# Patient Record
Sex: Female | Born: 1935 | Race: White | Hispanic: No | Smoking: Never smoker
Health system: Southern US, Community
[De-identification: ages and names within clinical notes are randomized; demographics above are authoritative.]

## PROBLEM LIST (undated history)

## (undated) DIAGNOSIS — H409 Unspecified glaucoma: Secondary | ICD-10-CM

## (undated) DIAGNOSIS — E079 Disorder of thyroid, unspecified: Secondary | ICD-10-CM

## (undated) DIAGNOSIS — I10 Essential (primary) hypertension: Secondary | ICD-10-CM

## (undated) DIAGNOSIS — F0390 Unspecified dementia without behavioral disturbance: Secondary | ICD-10-CM

## (undated) HISTORY — PX: ABDOMINAL HYSTERECTOMY: SHX81

## (undated) HISTORY — PX: BACK SURGERY: SHX140

## (undated) HISTORY — PX: OTHER SURGICAL HISTORY: SHX169

## (undated) HISTORY — PX: EYE SURGERY: SHX253

## (undated) HISTORY — DX: Unspecified glaucoma: H40.9

## (undated) HISTORY — DX: Unspecified dementia, unspecified severity, without behavioral disturbance, psychotic disturbance, mood disturbance, and anxiety: F03.90

---

## 2001-11-09 ENCOUNTER — Ambulatory Visit (HOSPITAL_COMMUNITY): Admission: RE | Admit: 2001-11-09 | Discharge: 2001-11-09 | Payer: Self-pay | Admitting: Internal Medicine

## 2001-11-09 ENCOUNTER — Encounter: Payer: Self-pay | Admitting: Internal Medicine

## 2001-12-23 ENCOUNTER — Encounter: Payer: Self-pay | Admitting: *Deleted

## 2001-12-23 ENCOUNTER — Emergency Department (HOSPITAL_COMMUNITY): Admission: EM | Admit: 2001-12-23 | Discharge: 2001-12-23 | Payer: Self-pay | Admitting: *Deleted

## 2002-09-28 HISTORY — PX: THYROIDECTOMY: SHX17

## 2002-11-10 ENCOUNTER — Ambulatory Visit (HOSPITAL_COMMUNITY): Admission: RE | Admit: 2002-11-10 | Discharge: 2002-11-10 | Payer: Self-pay | Admitting: Internal Medicine

## 2002-11-10 ENCOUNTER — Encounter: Payer: Self-pay | Admitting: Internal Medicine

## 2003-01-13 ENCOUNTER — Emergency Department (HOSPITAL_COMMUNITY): Admission: EM | Admit: 2003-01-13 | Discharge: 2003-01-13 | Payer: Self-pay | Admitting: Emergency Medicine

## 2003-01-29 ENCOUNTER — Encounter: Payer: Self-pay | Admitting: Otolaryngology

## 2003-01-29 ENCOUNTER — Ambulatory Visit (HOSPITAL_COMMUNITY): Admission: RE | Admit: 2003-01-29 | Discharge: 2003-01-29 | Payer: Self-pay | Admitting: Otolaryngology

## 2003-01-30 ENCOUNTER — Ambulatory Visit (HOSPITAL_COMMUNITY): Admission: RE | Admit: 2003-01-30 | Discharge: 2003-01-30 | Payer: Self-pay | Admitting: Internal Medicine

## 2003-02-22 ENCOUNTER — Encounter: Payer: Self-pay | Admitting: Otolaryngology

## 2003-02-22 ENCOUNTER — Encounter: Admission: RE | Admit: 2003-02-22 | Discharge: 2003-02-22 | Payer: Self-pay | Admitting: Otolaryngology

## 2003-04-17 ENCOUNTER — Encounter: Admission: RE | Admit: 2003-04-17 | Discharge: 2003-04-17 | Payer: Self-pay | Admitting: Otolaryngology

## 2003-04-17 ENCOUNTER — Encounter: Payer: Self-pay | Admitting: Otolaryngology

## 2003-05-30 ENCOUNTER — Encounter: Payer: Self-pay | Admitting: Otolaryngology

## 2003-05-30 ENCOUNTER — Encounter: Admission: RE | Admit: 2003-05-30 | Discharge: 2003-05-30 | Payer: Self-pay | Admitting: Otolaryngology

## 2003-06-25 ENCOUNTER — Encounter: Payer: Self-pay | Admitting: Otolaryngology

## 2003-06-28 ENCOUNTER — Observation Stay (HOSPITAL_COMMUNITY): Admission: RE | Admit: 2003-06-28 | Discharge: 2003-06-29 | Payer: Self-pay | Admitting: Otolaryngology

## 2003-06-28 ENCOUNTER — Encounter (INDEPENDENT_AMBULATORY_CARE_PROVIDER_SITE_OTHER): Payer: Self-pay | Admitting: *Deleted

## 2003-08-08 ENCOUNTER — Ambulatory Visit (HOSPITAL_COMMUNITY): Admission: RE | Admit: 2003-08-08 | Discharge: 2003-08-08 | Payer: Self-pay | Admitting: Internal Medicine

## 2003-08-14 ENCOUNTER — Ambulatory Visit (HOSPITAL_COMMUNITY): Admission: RE | Admit: 2003-08-14 | Discharge: 2003-08-14 | Payer: Self-pay | Admitting: Internal Medicine

## 2003-08-24 ENCOUNTER — Ambulatory Visit (HOSPITAL_COMMUNITY): Admission: RE | Admit: 2003-08-24 | Discharge: 2003-08-24 | Payer: Self-pay | Admitting: Otolaryngology

## 2003-11-13 ENCOUNTER — Ambulatory Visit (HOSPITAL_COMMUNITY): Admission: RE | Admit: 2003-11-13 | Discharge: 2003-11-13 | Payer: Self-pay | Admitting: Internal Medicine

## 2004-01-28 ENCOUNTER — Ambulatory Visit (HOSPITAL_COMMUNITY): Admission: RE | Admit: 2004-01-28 | Discharge: 2004-01-28 | Payer: Self-pay | Admitting: Internal Medicine

## 2004-02-20 ENCOUNTER — Ambulatory Visit (HOSPITAL_COMMUNITY): Admission: RE | Admit: 2004-02-20 | Discharge: 2004-02-20 | Payer: Self-pay | Admitting: Specialist

## 2004-11-14 ENCOUNTER — Ambulatory Visit (HOSPITAL_COMMUNITY): Admission: RE | Admit: 2004-11-14 | Discharge: 2004-11-14 | Payer: Self-pay | Admitting: Family Medicine

## 2005-01-21 ENCOUNTER — Ambulatory Visit: Payer: Self-pay | Admitting: Internal Medicine

## 2005-01-28 ENCOUNTER — Ambulatory Visit (HOSPITAL_COMMUNITY): Admission: RE | Admit: 2005-01-28 | Discharge: 2005-01-28 | Payer: Self-pay | Admitting: Family Medicine

## 2005-01-29 ENCOUNTER — Ambulatory Visit: Payer: Self-pay | Admitting: Cardiology

## 2005-01-29 ENCOUNTER — Inpatient Hospital Stay (HOSPITAL_COMMUNITY): Admission: AD | Admit: 2005-01-29 | Discharge: 2005-01-30 | Payer: Self-pay | Admitting: Internal Medicine

## 2005-03-17 ENCOUNTER — Ambulatory Visit (HOSPITAL_COMMUNITY): Admission: RE | Admit: 2005-03-17 | Discharge: 2005-03-17 | Payer: Self-pay | Admitting: Family Medicine

## 2005-03-23 ENCOUNTER — Ambulatory Visit: Payer: Self-pay | Admitting: Internal Medicine

## 2005-03-24 ENCOUNTER — Ambulatory Visit (HOSPITAL_COMMUNITY): Admission: RE | Admit: 2005-03-24 | Discharge: 2005-03-24 | Payer: Self-pay | Admitting: Family Medicine

## 2005-06-24 ENCOUNTER — Ambulatory Visit: Payer: Self-pay | Admitting: Internal Medicine

## 2005-07-03 ENCOUNTER — Ambulatory Visit (HOSPITAL_COMMUNITY): Admission: RE | Admit: 2005-07-03 | Discharge: 2005-07-03 | Payer: Self-pay | Admitting: Family Medicine

## 2005-11-10 ENCOUNTER — Ambulatory Visit (HOSPITAL_COMMUNITY): Admission: RE | Admit: 2005-11-10 | Discharge: 2005-11-10 | Payer: Self-pay | Admitting: Family Medicine

## 2005-11-26 ENCOUNTER — Ambulatory Visit (HOSPITAL_COMMUNITY): Admission: RE | Admit: 2005-11-26 | Discharge: 2005-11-26 | Payer: Self-pay | Admitting: Family Medicine

## 2005-11-26 ENCOUNTER — Ambulatory Visit: Payer: Self-pay | Admitting: Internal Medicine

## 2006-06-09 ENCOUNTER — Ambulatory Visit: Payer: Self-pay | Admitting: Internal Medicine

## 2006-09-04 ENCOUNTER — Emergency Department (HOSPITAL_COMMUNITY): Admission: EM | Admit: 2006-09-04 | Discharge: 2006-09-04 | Payer: Self-pay | Admitting: Emergency Medicine

## 2006-09-06 ENCOUNTER — Ambulatory Visit: Payer: Self-pay | Admitting: Orthopedic Surgery

## 2006-09-27 ENCOUNTER — Ambulatory Visit: Payer: Self-pay | Admitting: Orthopedic Surgery

## 2006-10-18 ENCOUNTER — Ambulatory Visit (HOSPITAL_COMMUNITY): Admission: RE | Admit: 2006-10-18 | Discharge: 2006-10-18 | Payer: Self-pay | Admitting: Family Medicine

## 2006-12-01 ENCOUNTER — Ambulatory Visit: Payer: Self-pay | Admitting: Internal Medicine

## 2006-12-07 ENCOUNTER — Ambulatory Visit (HOSPITAL_COMMUNITY): Admission: RE | Admit: 2006-12-07 | Discharge: 2006-12-07 | Payer: Self-pay | Admitting: Family Medicine

## 2007-02-10 ENCOUNTER — Ambulatory Visit: Payer: Self-pay | Admitting: Orthopedic Surgery

## 2007-02-14 ENCOUNTER — Ambulatory Visit (HOSPITAL_COMMUNITY): Admission: RE | Admit: 2007-02-14 | Discharge: 2007-02-14 | Payer: Self-pay | Admitting: Orthopedic Surgery

## 2007-02-22 ENCOUNTER — Ambulatory Visit: Payer: Self-pay | Admitting: Orthopedic Surgery

## 2007-03-24 ENCOUNTER — Ambulatory Visit: Payer: Self-pay | Admitting: Orthopedic Surgery

## 2007-04-21 ENCOUNTER — Ambulatory Visit: Payer: Self-pay | Admitting: Orthopedic Surgery

## 2007-06-06 IMAGING — CR DG CHEST 2V
2 series · 2 of 2 positions shown · non-contrast
Comparison: none

HISTORY: Chest pain, dyspnea

CHEST 2 VIEWS:
Comparison 01/29/2005
Normal heart size, mediastinal contours, and pulmonary vascularity.
Changes of COPD and chronic bronchitis.
Mild chronic interstitial prominence stable.
No acute infiltrate or effusion.
Mild bony demineralization.

[view not recorded (1 of 2)]
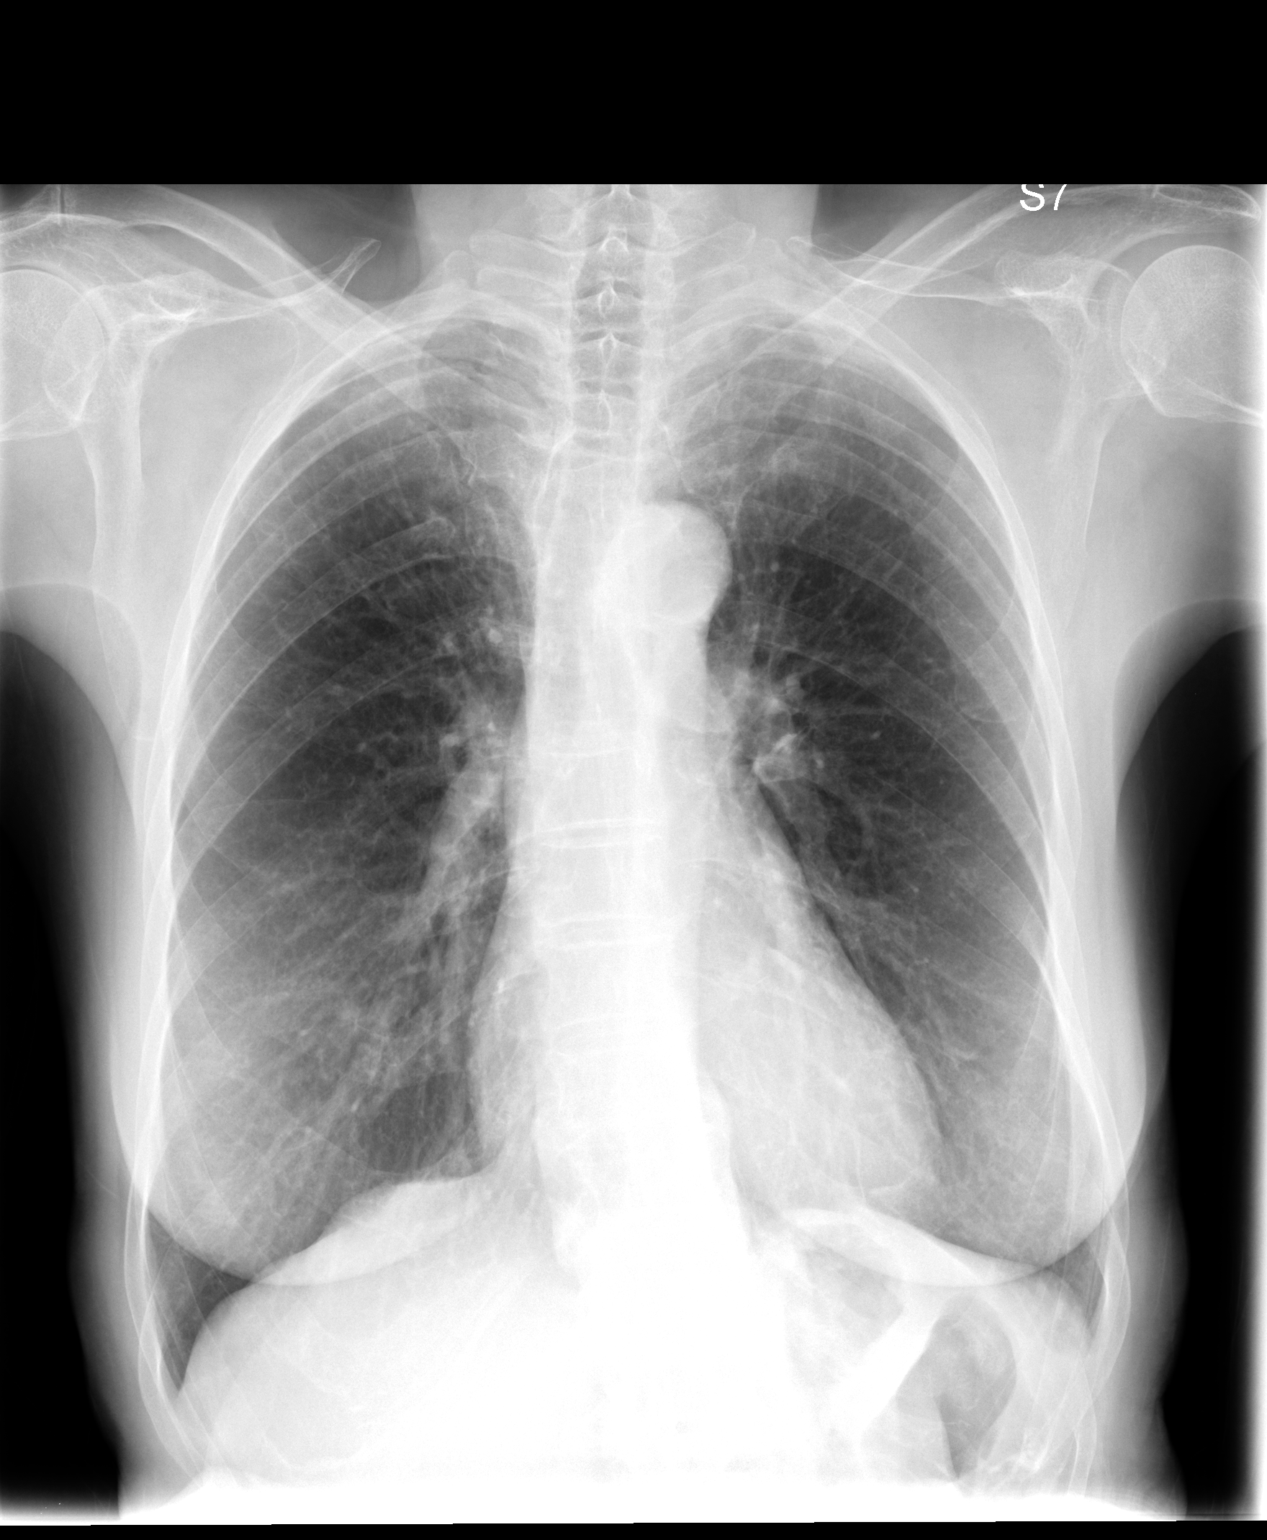

[view not recorded (2 of 2)]
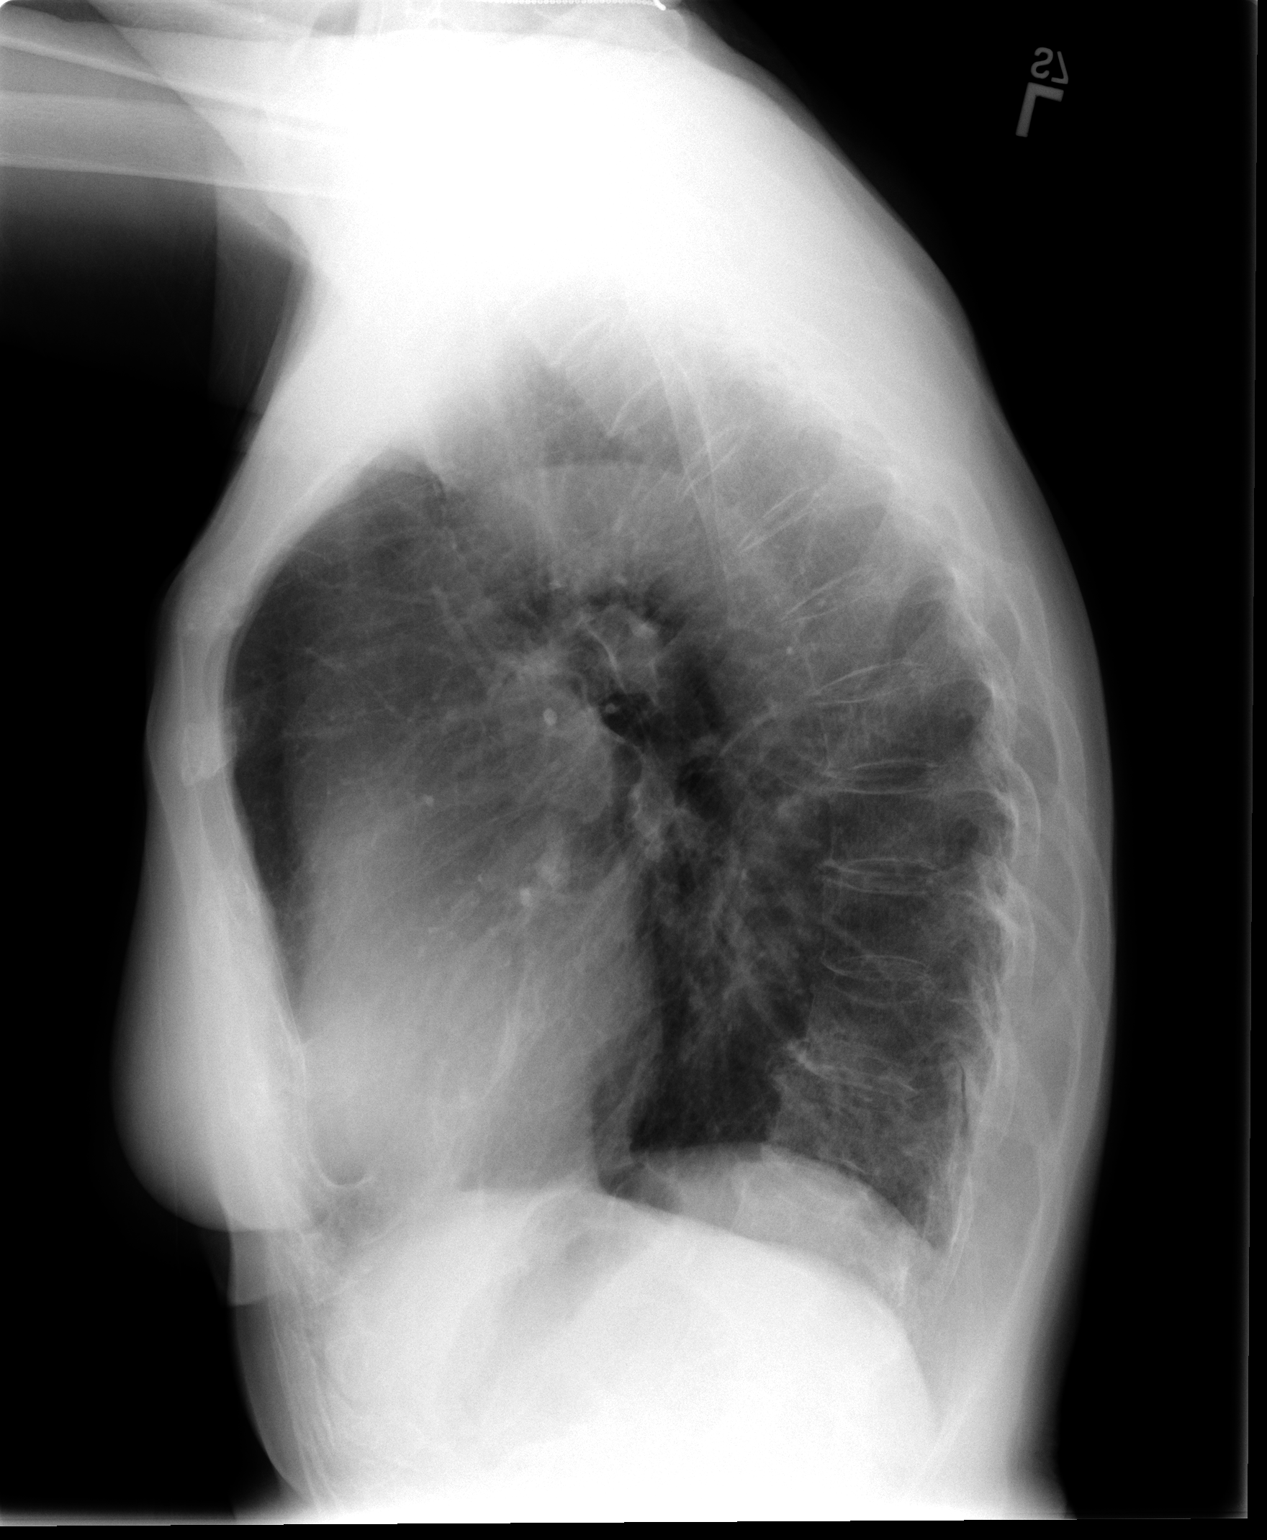

[2 of 2 positions shown; findings below may reference images not displayed]

IMPRESSION: COPD with chronic bronchitic and interstitial lung disease changes.
No acute abnormalities.

## 2007-07-04 ENCOUNTER — Ambulatory Visit (HOSPITAL_COMMUNITY): Admission: RE | Admit: 2007-07-04 | Discharge: 2007-07-04 | Payer: Self-pay | Admitting: Family Medicine

## 2007-12-09 ENCOUNTER — Ambulatory Visit (HOSPITAL_COMMUNITY): Admission: RE | Admit: 2007-12-09 | Discharge: 2007-12-09 | Payer: Self-pay | Admitting: Family Medicine

## 2010-06-07 ENCOUNTER — Ambulatory Visit: Payer: Self-pay | Admitting: Cardiology

## 2010-06-07 ENCOUNTER — Inpatient Hospital Stay (HOSPITAL_COMMUNITY): Admission: EM | Admit: 2010-06-07 | Discharge: 2010-06-11 | Payer: Self-pay | Admitting: Emergency Medicine

## 2010-06-10 ENCOUNTER — Encounter (INDEPENDENT_AMBULATORY_CARE_PROVIDER_SITE_OTHER): Payer: Self-pay | Admitting: Internal Medicine

## 2010-09-09 ENCOUNTER — Ambulatory Visit: Payer: Self-pay | Admitting: Internal Medicine

## 2010-10-18 ENCOUNTER — Encounter: Payer: Self-pay | Admitting: Internal Medicine

## 2010-10-19 ENCOUNTER — Encounter: Payer: Self-pay | Admitting: Family Medicine

## 2010-12-11 LAB — DIFFERENTIAL
Basophils Absolute: 0.1 10*3/uL (ref 0.0–0.1)
Basophils Absolute: 0.1 10*3/uL (ref 0.0–0.1)
Basophils Relative: 1 % (ref 0–1)
Eosinophils Absolute: 0.2 10*3/uL (ref 0.0–0.7)
Eosinophils Relative: 2 % (ref 0–5)
Eosinophils Relative: 3 % (ref 0–5)
Eosinophils Relative: 6 % — ABNORMAL HIGH (ref 0–5)
Lymphocytes Relative: 16 % (ref 12–46)
Lymphocytes Relative: 24 % (ref 12–46)
Lymphocytes Relative: 24 % (ref 12–46)
Lymphs Abs: 1.7 10*3/uL (ref 0.7–4.0)
Lymphs Abs: 1.8 10*3/uL (ref 0.7–4.0)
Lymphs Abs: 2.4 10*3/uL (ref 0.7–4.0)
Monocytes Absolute: 0.8 10*3/uL (ref 0.1–1.0)
Monocytes Absolute: 1 10*3/uL (ref 0.1–1.0)
Monocytes Relative: 11 % (ref 3–12)
Neutro Abs: 6.3 10*3/uL (ref 1.7–7.7)
Neutro Abs: 7.7 10*3/uL (ref 1.7–7.7)
Neutrophils Relative %: 71 % (ref 43–77)

## 2010-12-11 LAB — CULTURE, BLOOD (ROUTINE X 2)
Culture: NO GROWTH
Report Status: 9152011
Report Status: 9152011

## 2010-12-11 LAB — BASIC METABOLIC PANEL
BUN: 12 mg/dL (ref 6–23)
BUN: 16 mg/dL (ref 6–23)
CO2: 26 mEq/L (ref 19–32)
CO2: 31 mEq/L (ref 19–32)
Calcium: 8.8 mg/dL (ref 8.4–10.5)
Chloride: 105 mEq/L (ref 96–112)
Chloride: 107 mEq/L (ref 96–112)
Chloride: 109 mEq/L (ref 96–112)
Creatinine, Ser: 0.78 mg/dL (ref 0.4–1.2)
GFR calc Af Amer: >60 mL/min (ref >60)
GFR calc Af Amer: >60 mL/min (ref >60)
GFR calc Af Amer: >60 mL/min (ref >60)
GFR calc non Af Amer: >60 mL/min (ref >60)
GFR calc non Af Amer: >60 mL/min (ref >60)
Glucose, Bld: 94 mg/dL (ref 70–99)
Potassium: 3.2 mEq/L — ABNORMAL LOW (ref 3.5–5.1)
Potassium: 3.7 mEq/L (ref 3.5–5.1)
Potassium: 3.9 mEq/L (ref 3.5–5.1)
Potassium: 4 mEq/L (ref 3.5–5.1)
Sodium: 138 mEq/L (ref 135–145)
Sodium: 138 mEq/L (ref 135–145)
Sodium: 141 mEq/L (ref 135–145)

## 2010-12-11 LAB — CBC
HCT: 37.5 % (ref 36.0–46.0)
HCT: 39.8 % (ref 36.0–46.0)
Hemoglobin: 12.6 g/dL (ref 12.0–15.0)
MCV: 95.3 fL (ref 78.0–100.0)
MCV: 95.6 fL (ref 78.0–100.0)
Platelets: 182 10*3/uL (ref 150–400)
Platelets: 194 10*3/uL (ref 150–400)
Platelets: 196 10*3/uL (ref 150–400)
RBC: 3.92 MIL/uL (ref 3.87–5.11)
RBC: 3.92 MIL/uL (ref 3.87–5.11)
RBC: 4.17 MIL/uL (ref 3.87–5.11)
RBC: 4.36 MIL/uL (ref 3.87–5.11)
RDW: 14.3 % (ref 11.5–15.5)
RDW: 14.4 % (ref 11.5–15.5)
WBC: 10.1 10*3/uL (ref 4.0–10.5)
WBC: 10.6 10*3/uL — ABNORMAL HIGH (ref 4.0–10.5)
WBC: 7.3 10*3/uL (ref 4.0–10.5)
WBC: 8.4 10*3/uL (ref 4.0–10.5)

## 2010-12-11 LAB — CARDIAC PANEL(CRET KIN+CKTOT+MB+TROPI)
CK, MB: 1 ng/mL (ref 0.3–4.0)
CK, MB: 1.1 ng/mL (ref 0.3–4.0)
CK, MB: 1.2 ng/mL (ref 0.3–4.0)
Total CK: 51 U/L (ref 7–177)
Total CK: 61 U/L (ref 7–177)
Total CK: 62 U/L (ref 7–177)
Troponin I: <0.01 ng/mL (ref 0.00–0.06)

## 2010-12-11 LAB — URINALYSIS, ROUTINE W REFLEX MICROSCOPIC
Bilirubin Urine: NEGATIVE
Glucose, UA: NEGATIVE mg/dL
Ketones, ur: NEGATIVE mg/dL
Nitrite: NEGATIVE
Protein, ur: NEGATIVE mg/dL

## 2010-12-11 LAB — TROPONIN I: Troponin I: <0.01 ng/mL (ref 0.00–0.06)

## 2010-12-11 LAB — URINE CULTURE
Colony Count: 25000
Colony Count: 35000
Culture  Setup Time: 201109112114
Culture  Setup Time: 201109122056

## 2010-12-11 LAB — VITAMIN B12: Vitamin B-12: 629 pg/mL (ref 211–911)

## 2010-12-11 LAB — CK TOTAL AND CKMB (NOT AT ARMC)
CK, MB: 0.6 ng/mL (ref 0.3–4.0)
Relative Index: INVALID (ref 0.0–2.5)

## 2010-12-11 LAB — RPR: RPR Ser Ql: NONREACTIVE

## 2010-12-11 LAB — TSH: TSH: 14.049 u[IU]/mL — ABNORMAL HIGH (ref 0.350–4.500)

## 2011-02-13 NOTE — Procedures (Signed)
NAMEJUSTUS, DUERR                ACCOUNT NO.:  [id number]   MEDICAL RECORD NO.:  [id number]          PATIENT TYPE:  INP   LOCATION:  A222                          FACILITY:  [facility]   PHYSICIAN:  [place] Bing, M.D.  DATE OF BIRTH:  1933-12-19   DATE OF PROCEDURE:  01/30/2005  DATE OF DISCHARGE:  01/30/2005                                  ECHOCARDIOGRAM   REFERRING PHYSICIAN:  Osvaldo Shipper, M.D.   CLINICAL DATA:  A 77 year old woman with chest pain and EKG abnormalities.   M-MODE:  Aorta 3.1.  Left atrium 4.3.  Septum 1.6.  Posterior wall 1.3.  LV  diastole 3.4.  LV systole 2.2.   RESULTS:  1.  Technically adequate echocardiographic study.  2.  Normal left atrium, right atrium, and right ventricle.  3.  Normal aortic valve.  4.  Mild mitral valve thickening.  Mild regurgitation.  5.  Normal tricuspid and pulmonic valve.  6.  Trivial tricuspid regurgitation with normal estimated RV systolic      pressure.  7.  Normal left ventricular size.  Borderline hypertrophy.  Most notable in      the proximal septum.  Normal regional and global LV systolic function.  8.  Normal IVC.      RR/MEDQ  D:  01/30/2005  T:  01/30/2005  Job:  [id number]

## 2011-02-13 NOTE — Op Note (Signed)
NAME:  [name], [name]                          ACCOUNT NO.:  [id number]   MEDICAL RECORD NO.:  [id number]                   PATIENT TYPE:  AMB   LOCATION:  DAY                                  FACILITY:  [facility]   PHYSICIAN:  Lionel December, M.D.                 DATE OF BIRTH:  [birth year]   DATE OF PROCEDURE:  08/14/2003  DATE OF DISCHARGE:  08/14/2003                                 OPERATIVE REPORT   PROCEDURE:  Esophageal manometry.   INDICATIONS:  [name] is a 77 year old Caucasian female who continues to have  problems with dysphagia, secretions in her throat and inability to eat.  She  was felt to have dysphagia secondary to goiter.  Her esophagus seems pushed  to the side, however, surgery has not corrected this problem.  She has had  barium, EGD with ED without symptomatic benefit.  She also has not responded  to aggressive antireflux therapy. She is now returning for esophageal  manometry with attention to UES in addition to evaluation of LES as well as  esophageal body.  Informed consent for the procedure was obtained from the  patient.   METHOD:  The esophageal manometry study was performed using [employer]astrosoft Upper Gastrointestinal Polygram, version 6.40, using an  Arndorfer infusion system at 0.5 cc/min. The lower esophageal sphincter  (LES) was identified in four ports with the standard station pull through  technique. For esophageal body pressures, the tip of the catheter was placed  3 cm above the proximal aspect of the LES. Ten wet swallows were taken with  ports 5 cm apart in the esophageal body. This procedure was reviewed with  the patient prior to performing it.   FINDINGS:  Esophageal body 10 out of 10 swallows were peristaltic.   All were low amplitude.   Mean amplitude at 13 cm was 43.9 at 8 cm 39.8 at 3 cm 6.1 at mean and distal  two ports was 23.0.   Mean duration at 13 cm was 3.5 seconds at 8 cm 3.5 seconds, at 3 cm 1.9  seconds and mean and  distal two ports was 2.7 seconds.   Lower esophageal sphincter was located between 41 and 44 cm from the  incisors.   Mean resting LES pressure:  16.3.   Relaxation was 99%.   UES was not evaluated on this study.   IMPRESSION:  Abnormal esophageal motility.  LES pressure is normal with near  complete relaxation; however, body study is abnormal with low amplitude  which is more or less a nonspecific finding.   Upper esophageal sphincter was not studied and the patient, therefore, will  be brought back to complete this evaluation.      ___________________________________________  Lionel December, M.D.   NR/MEDQ  D:  09/13/2003  T:  09/15/2003  Job:  [id number]   cc:   Lucky Cowboy, M.D.  [address] [place]  [place] [address]  Fax: [phone]

## 2011-02-13 NOTE — Op Note (Signed)
   NAME:  [name], [name]                          ACCOUNT NO.:  [id number]   MEDICAL RECORD NO.:  [id number]                   PATIENT TYPE:  AMB   LOCATION:  DAY                                  FACILITY:  [facility]   PHYSICIAN:  Lionel December, M.D.                 DATE OF BIRTH:  March 07, 1934   DATE OF PROCEDURE:  01/30/2003  DATE OF DISCHARGE:  01/30/2003                                 OPERATIVE REPORT   PROCEDURE:  Esophageal manometry.   INDICATIONS:  Throat symptoms, questionable atypical vascular esophageal  reflux disease, unresponsive to proton pump inhibitor.   PRIOR STUDIES:  Barium esophagram on 01/29/2003 revealed marked deviation of  the lower cervical esophagus rightward, along with deviation of the trachea  rightward, probably due to thyroid goiter.  Small sliding hiatal hernia with  minimal, inducible gastroesophageal reflux.  No esophageal strictures or  masses.   METHOD:  The esophageal manometry study was performed using [employer]  Medical Gastrosoft Upper Gastrointestinal Polygram, version 6.40, using an  Arndorfer infusion system at 0.5 cc/min. The lower esophageal sphincter  (LES) was identified in four ports with the standard station pull through  technique. For esophageal body pressures, the tip of the catheter was placed  3 cm above the proximal aspect of the LES. Ten wet swallows were taken with  ports 5 cm apart in the esophageal body. This procedure was reviewed with  the patient prior to performing it.   FINDINGS:  Esophageal body amplitude (mmHg):  29.7 at 13 cm (normal 38-102),  50.1 at 8 cm (normal 49-131), 143.5 at 3 cm (normal 64-154), 96.8 mean  (distal two ports) (normal 59-139).   Duration (seconds):  2.8 at 13 cm (normal 3-5), 4.2 at 8 cm (normal 3-5),  5.6 at 3 cm (normal 2.5-3.5), 4.9 mean (distal two ports) (normal 3.5).   Contractions (with wet swallows):  100% peristaltic; however, 40% were low  amplitude.   Lower esophageal sphincter  (LES):   Located at 48-43 cm (from nares).  Resting pressure:  11.7 mmHg.  Relaxation:  Adequate.   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   RECOMMENDATION:  Please see ambulatory esophageal pH study report.   The study was reviewed with Dr. Karilyn Cota as well.  In addition, barium  esophagram films were reviewed which did, indeed, reveal marked deviation of  the lower cervical esophagus rightward felt to be due to thyroid goiter     [name], P.A.                        Lionel December, M.D.    LL/MEDQ  D:  02/05/2003  T:  02/06/2003  Job:  [id number]   cc:   Lucky Cowboy, M.D.  [address] [place]  [place] [address]  Fax: [phone]

## 2011-02-13 NOTE — Op Note (Signed)
NAME:  [name], [name]                          ACCOUNT NO.:  [id number]   MEDICAL RECORD NO.:  [id number]                   PATIENT TYPE:   LOCATION:                                       FACILITY:   PHYSICIAN:  Lionel December, M.D.                 DATE OF BIRTH:  11-17-1933   DATE OF PROCEDURE:  01/31/2003  DATE OF DISCHARGE:                                 OPERATIVE REPORT   PROCEDURE:  Ambulatory esophageal pH report.   REFERRING PHYSICIAN:  Lucky Cowboy, M.D.   INDICATIONS:  Please see esophageal manometry report.   PRIOR STUDIES:  Please see esophageal manometry report.   METHOD:  The pH electrodes were placed 20 and 5 cm above the proximal border  of the manometrically determined lower esophageal sphincter (LES). These  electrodes were defined as channels 1 and 2, respectively. The data was  converted using the [employer], version 2.30. A reflux  episode was defined as a drop in pH below 4.0. The procedure was reviewed  with the patient prior to performing it.   FINDINGS:  Proximal border of the LES (location from nares):  43 cm.  Study duration:  24 hours.  Channel 2 analysis:  Number of reflux episodes:  15 all of which were upright, none corresponded  with meals, and 3 postprandially and 1 with regurgitation.  Number of reflux episodes longer than 5 minutes:  0.  Longest reflux episode:  1 minute.  Total time pH below 4.0 (min):  6 minutes.  Fraction of total time pH below 4.0:  4.4%.  DeMeester score (DeMeester normals <14.7 95th percentile):  2.2.   IMPRESSION:  1. This is essentially a normal ambulatory esophageal pH study (performed     with no acid suppression therapy).  2. Channel 2 analysis reveals transient acid reflux with meals and     postprandially, within limits of normal.  3. All episodes of Channel 2 acid reflux occurred in the upright position,     none with meals, 3 postprandially.  Only 1 episode of the 8 reported     episodes  of regurgitation correlated with pH less than 4.  4. Channel 1 analysis revealed 5 episodes of reflux all of which occurred     upright, 4 occurred with meals, and 1 occurred postprandially.  The     longest reflux episode was 1 minute; however, Channel 1 now shows normals     have not been well defined.  5. This study was reviewed by Dr. Karilyn Cota as well.    RECOMMENDATION/PLAN:  Based on esophageal manometry and pH study it is  suspected that the patient's throat symptoms are due to deviated esophagus  from thyroid goiter.  Patient is to follow up with Dr. Lucky Cowboy regarding  further recommendations.  The above findings have been discussed with Dr.  Lucky Cowboy and patient.  [name], P.A.                        Lionel December, M.D.    LL/MEDQ  D:  02/05/2003  T:  02/06/2003  Job:  [id number]   cc:   Lucky Cowboy, M.D.  [address] [place]  [place] [address]  Fax: [phone]

## 2011-02-13 NOTE — Op Note (Signed)
NAME:  [name], [name]                          ACCOUNT NO.:  [id number]   MEDICAL RECORD NO.:  [id number]                   PATIENT TYPE:  AMB   LOCATION:  DAY                                  FACILITY:  [facility]   PHYSICIAN:  Lionel December, M.D.                 DATE OF BIRTH:  January 09, 1934   DATE OF PROCEDURE:  09/14/2003  DATE OF DISCHARGE:                                 OPERATIVE REPORT   PROCEDURE:  Esophageal manometry with attention to UES.   INDICATIONS FOR PROCEDURE:  [name] is a 77 year old Caucasian female who has  been experiencing dysphagia with a lot of throat symptoms.  She came here a  few weeks ago for esophageal manometry.  The plan was for her to UES study  as well but this was not performed. She was therefore asked to come back for  the study.   FINDINGS:  UES is located between 19-21 cm from the incisors.  Basal  pressure is estimated to be about 45 mmHg.  Relaxation is however  incomplete.  Although this was not a body study, the esophageal body tracing  was so much better than on the prior study and all of the swallows were  peristaltic.   IMPRESSION:  There appears to be good coordination between pharyngeal  contraction and UES relaxation.   Although this was not a body study, body tracing was so much better and all  the swallows appeared to be peristaltic.  As noted on previous study, the  pressure in the distal leads was quite low.   ASSESSMENT:  Normal upper esophageal sphincter basal pressure, however,  relaxation is incomplete.   Please note that this study was performed with our regular catheter.   RECOMMENDATIONS:  I would recommend that she be further evaluated with  catheter which was designated for UES study.  We could get more reliable  reading.  If the patient is agreeable will make arrangements for her to go  to [facility].      ___________________________________________                                            Lionel December, M.D.   NR/MEDQ  D:  09/14/2003  T:  09/15/2003  Job:  [id number]   cc:   Lucky Cowboy, M.D.  [address] [place]  [place] [address]  Fax: [phone]

## 2011-02-13 NOTE — H&P (Signed)
[name], [name]                ACCOUNT NO.:  [id number]   MEDICAL RECORD NO.:  [id number]          PATIENT TYPE:  INP   LOCATION:  A222                          FACILITY:  [facility]   PHYSICIAN:  Osvaldo Shipper, MD     DATE OF BIRTH:  [birth year]   DATE OF ADMISSION:  01/29/2005  DATE OF DISCHARGE:  LH                                HISTORY & PHYSICAL   PRIMARY MEDICAL DOCTOR:  Jeoffrey Massed, M.D.   ADMITTING DIAGNOSIS:  1.  Exertional chest pain, possible unstable angina.  2.  Hypothyroidism.  3.  Anxiety disorder.  4.  Osteoporosis.   CHIEF COMPLAINT:  Retrosternal chest pressure.   HISTORY OF PRESENT ILLNESS:  The patient is 77 year old Caucasian female  with a past medical history of hypothyroidism, anxiety disorder,  osteoporosis, who presented to her primary doctor's office today complaining  of retrosternal chest discomfort.  The patient mentioned that over the past  1-1/2 to 2 months she has been noticing that every time she goes for a walk,  she developed a pressure-like sensation in her retrosternal chest area.  The  intensity of this pain is about 4-5/10.  This sensation does not radiate to  her neck, jaw, or any of her arms.  The pain also does not radiate to her  back.  When the patient stops walking, the sensation eases up and disappears  after some time.  Earlier, these symptoms used to happen about once or twice  a day, but lately she has been getting chest pressures more often.   Two days ago, she was at her sister's place, and she was helping her with  her household chores, and she experienced this pressure sensation.  She  rested for a while, and the sensation subsided.  The patient did not  experience any diaphoresis, nausea, vomiting.  She did mention that she used  to get occasionally short of breath.  The patient also gives a history of  palpitations with these symptoms.  No history of any syncopal episodes.  No  history of any focal weakness.  The pain  is not related to food intake,  breathing, or body posture.   The patient once again experienced chest pressure last night; however, this  time the pain came about when she was sleeping and she woke up because of  the pain.  This morning, she went to her doctor's office, at which point she  was still complaining of slight chest discomfort, and not as bad as it was  last night.  At this point, Dr. Milinda Cave called me, and we decided to admit  the patient directly to the hospital.   MEDICATIONS:  The patient takes the following at home -  1.  Alprazolam 0.25 mg q.i.d.  2.  Fosamax 70 mg q weekly on Wednesdays.  3.  Levothyroxine 0.75 mg daily.  4.  Lumigan 0.03% eye drop, one drop each eye q.h.s.  5.  Mucinex.  6.  Citrucel.  7.  Vitamin D.   ALLERGIES:  The patient is not allergic to any medication.   PAST  MEDICAL HISTORY:  1.  Significant for osteoporosis for the past 2 years.  She has been taking      Fosamax for the past 2 years.  2.  She also has a history of thyroidectomy in September of 2004 for      probable dysphagia.  3.  She has also had sinus surgery in the past.  4.  She has had EGDs done in the past because of swallowing difficulties.  5.  She has had esophageal manometry studies done also in the past.  6.  The patient has had history of gastrectomy.  7.  Breast surgery for a benign cyst.  8.  Back surgery.  9.  The patient has not had any coronary artery disease in the past, no      history of strokes, lung disease, diabetes, or hypertension.   SOCIAL HISTORY:  The patient lives alone.  Her husband died at the age of  59.  He apparently committed suicide.  The patient has never smoked.  Does  not consume any alcohol.  Does not have any history of illicit drug use.   The patient mentioned that she, at baseline, walks 2-3 miles on a daily  basis, but lately has not been doing that because the chest discomfort.   FAMILY HISTORY:  Her father had hypertension and  emphysema.  Her mother had  a stroke.  There is apparently a very significant history of coronary artery  disease on her mother's side, which includes her mother's siblings and her  nieces and nephew.  Some of them had heart attacks at a very young age.  The  patient has 2 sisters, but none of them have coronary artery disease, as far  as she knows.  No history of diabetes in the family.   REVIEW OF SYSTEMS:  A 10-point review of systems was done, which elicited  just the findings for increased secretions in her mouth, which has been an  ongoing problem for almost 2 years.  Otherwise, she denied any other  significant findings.   PHYSICAL EXAMINATION:  VITAL SIGNS:  She is afebrile.  Blood pressure is  122/70's, heart rate of about 60-70 beats per minute, regular, respiratory  rate 16.  GENERAL:  This is an anxious-appearing Caucasian female in slight discomfort  because of the chest pressure.  HEENT:  There is no pallor, no icterus.  Oral mucosa is moist.  No lesions  are seen.  NECK:  Soft and supple.  LUNGS:  Clear to auscultation.  No wheezing, rhonchi, or rales appreciated.  CARDIOVASCULAR:  S1 and S2 normal, regular.  No murmurs appreciated.  No JVD  seen.  No carotid bruits are appreciated.  The chest pressure was not  reproducible by palpation.  ABDOMEN:  Soft.  Slight tenderness in the left lower quadrant.  No rebound  or rigidity.  Otherwise, no tenderness in the epigastric area.  Bowel sounds  are present in all 4 quadrants.  No organomegaly or masses appreciated.  EXTREMITIES:  The extremities did show minimal bilateral pedal edema right  at the ankles.  Peripheral pulses are palpable.  NEUROLOGIC:  The patient is alert, oriented x3.  No gross neurological  deficits are elicited.   LABORATORY DATA:  White count of 7.2 with a normal differential.  Hemoglobin  38.3, MCV 91, platelet count 225.  PT of 12.3, PTT 29.  D-dimer 0.22. Sodium of 142, potassium 3.9, chloride 107,  bicarb 31, glucose 68, BUN 11,  creatinine  0.7.  Liver function tests within normal limits.  Albumin of 3.4.  CK is 44.  Troponin is pending at this time.   EKG shows sinus rhythm.  It is a poor quality EKG, actually.  The axis  appears to be normal.  The PR interval and QT interval appear to be within  normal limits.  There is a prominent Q waves seen in the lead V1 and V2.  V3  appears to have a Q wave too. Otherwise, no ST elevation or depression is  appreciated.  T waves appear to be within normal limits.  The patient had an  EKG done earlier in his doctor's office, which also shows sinus rhythm,  normal axis, and Q waves in V1 and V2.  Again, no ST-T wave changes are  appreciated on that EKG, as well.   Chest x-ray shows chronic changes of COPD and bronchitis.  No clear evidence  of cardiomegaly.  There is a questionable nodular density in the right lower  chest.  Follow up x-ray with nipple markers has been recommended by the  radiologist.   IMPRESSION:  This is a 77 year old Caucasian female with a past medical  history of osteoporosis, hypothyroidism, anxiety disorder, and some upper GI  troubles, for which she has had multiple studies including EGDs and  esophageal manometry, who presents with exertional chest pain, ongoing for  about a month and a half to 2 months, and with one episode at rest.  The  character of the chest pain is typical for cardiac in origin.  The patient  was given 1 sublingual nitroglycerin here on the floor when she was  complaining of 4/10 chest pressure, and with 1 sublingual nitroglycerin, the  pressure eased up.  Other differential for this pressure could include  esophageal spasm, gastroesophageal reflux disease.  Pulmonary embolism is  unlikely in this patient with a normal D-dimer, and her clinical exam, as  well as her history do not really corroborate over the diagnosis of  pulmonary embolism.  The patient, however, does not have too many risk   factors.  However, since her symptoms are so typical, further evaluation is  merited.   PLAN:  1.  Chest pain.  We need to rule out acute coronary syndrome.  Hence, we are      admitting the patient to telemetry, and will do serial cardiac enzymes.      The patient will definitely need some kind of cardiac work-up, either at      least a stress test, if not an angiogram.  Hence, we will consult      [facility] to see the patient as soon as possible.  We will put      the patient on aspirin, Nitro paste, low-dose beta blocker.  Will check      her lipid profile.  Will do serial EKGs.  A 2-D echocardiogram will also      be ordered for this patient to evaluate ejection fraction, as well as to      look for wall motion abnormalities.  2.  Hypothyroidism.  A TSH and free T4 level will be checked on this      patient.  Synthroid will be continued.  3.  Osteoporosis.  The patient takes weekly Fosamax.  She has been taking     Fosamax for almost 2 years now, and that as a possibility of causes      these symptoms should be considered; however, less likely.  Further management decisions will be based on patient's response to initial  treatment, as well as results of initial testing.      GK/MEDQ  D:  01/29/2005  T:  01/29/2005  Job:  [id number]   cc:   Jeoffrey Massed, MD  [address]  [place]  [place] [address]  Fax: [phone]   Wagner Bing, M.D.

## 2011-02-13 NOTE — Discharge Summary (Signed)
[name], [name]                ACCOUNT NO.:  [id number]   MEDICAL RECORD NO.:  [id number]          PATIENT TYPE:  INP   LOCATION:  A222                          FACILITY:  [facility]   PHYSICIAN:  Osvaldo Shipper, MD     DATE OF BIRTH:  1934/06/24   DATE OF ADMISSION:  01/29/2005  DATE OF DISCHARGE:  05/05/2006LH                                 DISCHARGE SUMMARY   DISCHARGE DIAGNOSES:  1.  Chest pain, likely of noncardiac etiology.  2.  Hypothyroidism.  3.  Anxiety disorder.  4.  Depression.  5.  Osteoporosis.   Please review the H&P dictated May 4 for details regarding the patient's  presenting illness.   BRIEF HOSPITAL COURSE:  1.  Chest pain.  The patient was admitted with symptoms of angina.  At the      time of admission, the pain was fairly typical with the patient      mentioning that the symptoms appeared with exertion and getting relief      with rest and with nitroglycerin.  However, the patient continued to      have this chest pain even with rest, and the symptoms became more      atypical.  However, since the symptoms were considered so typical at the      time of admission, a stress test was ordered for this patient.  The      patient underwent a stress test the following day, which was entirely      negative.  Cardiology cleared the patient for discharge and recommended      following up with them in 1 month.  The patient was discharged on a baby      aspirin.  2.  Psychiatric disorder.  The patient was found to be extremely anxious at      the time of admission.  She was also found to be teary eyed.  Upon      further questioning, it was found that the patient's husband had      committed suicide about 4-5 months ago.  The patient had not seen any      kind of counselor and had not sought any psychiatric help following this      episode.  Since the patient was having extreme anxiety and was found to      be extremely sad during this admission, the ACT team was  consulted to      see the patient.  They saw the patient, and they recommended outpatient      mental health evaluation and followup.  The patient was not having any      active suicidal or homicidal ideation.  She was considered stable to be      discharged from a psychiatric point of view.  The patient was prescribed      Lexapro and was discharged on that.  3.  Her other medical problems including hypothyroidism and osteoporosis      remained stable.   DISCHARGE MEDICATIONS:  1.  The patient was discharged on baby aspirin 81 mg once daily.  2.  Lexapro 10 mg daily.  3.  She was asked to continue all of her other prehospitalization      medications.   PROCEDURES PERFORMED:  1.  Included a chest x-ray during this admission, which actually showed no      acute abnormality.  However, there was a questionable nodular density      seen in the right lower chest, and it was suspected to be related to      superimposed breast tissue and a breast shadow.  A follow-up upright PA      chest radiograph with nipple markers was recommended by the radiologist.      However, the patient had a very brief hospital stay, and this was not      done because this was not really an active and acute issue.  I will      contact Dr. Milinda Cave and have him schedule this procedure as an      outpatient.  2.  The patient underwent a stress test.  She exercised to a work load of      about 5 METS with a heart rate of 148 which was 97% of a predicted      maximum.  Imaging studies did not show any kind of ischemia.  EKG also      did not suggest any ischemic changes.   FOLLOW-UP CARE:  1.  With Dr. Maryjean Morn. McGowen in 1-2 weeks.  2.  With Dr. Dietrich Pates in a month.  3.  With outpatient mental health for her depression and anxiety.   DIET:  The patient may resume her prehospitalization diet and physical  activity as before.      GK/MEDQ  D:  02/03/2005  T:  02/03/2005  Job:  [id number]   cc:   Jeoffrey Massed, MD  [address]  [place]  [place] [address]  Fax: [phone]   Thornton Bing, M.D.

## 2011-02-13 NOTE — Op Note (Signed)
NAME:  [name], [name]                          ACCOUNT NO.:  [id number]   MEDICAL RECORD NO.:  [id number]                   PATIENT TYPE:  AMB   LOCATION:  DAY                                  FACILITY:  [facility]   PHYSICIAN:  Lionel December, M.D.                 DATE OF BIRTH:  Apr 23, 1934   DATE OF PROCEDURE:  DATE OF DISCHARGE:                                 OPERATIVE REPORT   PROCEDURE:  Esophagogastroduodenoscopy with esophageal dilatation.   ENDOSCOPIST:  Lionel December, M.D.   INDICATIONS:  [name] is a 77 year old Caucasian female who continues to  experience a lump in her throat, copious secretions, dysphagia, and  inability to eat.  She is found to have an enlarged thyroid which is pushing  the esophagus to the side.  She has had thyroidectomy; however, her symptoms  are not improved.  She was initially felt to have GERD and she has been on  antireflux therapy and a double dose PPI, but without symptomatic  improvement.  She has lost at least 15 pounds in the last 4-6 months.  She  is undergoing EGD and empiric dilatation of her esophagus/UES to see if it  would help ameliorate her symptoms.  The procedure and risks were reviewed  with the patient and informed consent was obtained.   PREOPERATIVE MEDICATIONS:  Cetacaine spray for oropharyngeal topical  anesthesia, Demerol 35 mg IV and Versed 4 mg IV in divided dose.   FINDINGS:  Procedure performed in endoscopy suite.  The patient's vital  signs and O2 saturation were monitored during the procedure and remained  stable.  The patient was placed in the left lateral recumbent position and  Olympus videoscope was passed via the oropharynx into the laryngopharynx.  Her tonsillar tissue was somewhat redundant and almost joining in the  midline anterior and above to the epiglottis.  The laryngopharynx was  carefully examined and no abnormalities were noted.  Her vocal cords were  symmetrical.  The scope was easily passed across the  UES into the esophagus.   ESOPHAGUS:  The mucosa of the esophagus was normal throughout.  Squamocolumnar junction was unremarkable.  There was a 3-cm size sliding  hiatal hernia.   STOMACH:  It was empty and distended very well with insufflation.  The folds  of the proximal stomach were normal.  Examination of the mucosa at body,  antrum, pyloric channel, as well as angularis, fundus, and cardia were  normal.   DUODENUM:  Examination of the bulb and second part of the duodenum was also  normal. Endoscope was withdrawn.   Esophagus was dilated by passing 58 [place] Maloney dilator to full  insertion.  The esophagus was examined, post-dilatation, there was no  mucosal injury noted.  The endoscope was withdrawn.  The patient tolerated  the procedure well.   FINAL DIAGNOSES:  1. Small sliding hiatal hernia otherwise normal esophagogastroduodenoscopy.  2.  Empiric dilatation of esophagus/upper esophageal sphincter (UES)     performed by passing a 58 French Maloney dilator which did not induce any     mucosal injury.   RECOMMENDATIONS:  1. Will start her on Lexapro 20 mg p.o. daily and Xanax 0.25 mg t.i.d.     p.r.n. hoping to allow her to cope with her symptoms which are now     intractable.  2. Will bring her back next week for esophageal manometry with emphasis on     upper esophageal sphincter.  If this study is normal, will have her     evaluated by speech pathologist prior to considering FEES study.      ___________________________________________                                            Lionel December, M.D.   NR/MEDQ  D:  08/08/2003  T:  08/08/2003  Job:  [id number]

## 2011-02-13 NOTE — Consult Note (Signed)
[name], [name]                ACCOUNT NO.:  [id number]   MEDICAL RECORD NO.:  [id number]          PATIENT TYPE:  INP   LOCATION:  A222                          FACILITY:  [facility]   PHYSICIAN:  [place] Bing, M.D.  DATE OF BIRTH:  August 06, 1934   DATE OF CONSULTATION:  01/29/2005  DATE OF DISCHARGE:  01/30/2005                                   CONSULTATION   REFERRING PHYSICIAN:  Osvaldo Shipper, M.D.   PRIMARY CARE PHYSICIAN:  Jeoffrey Massed, M.D.   PRIMARY CARDIOLOGIST:  Adell Bing, M.D.   HISTORY OF PRESENT ILLNESS:  A 77 year old woman without known coronary  disease admitted to the hospital with chest pain.  [name] has relatively  modest cardiovascular risk factors.  She has not had hypertension or  diabetes.  She is unaware of hyperlipidemia.  She does not use tobacco  products.  She reports a history over the last few weeks of a pressure-type  sensation over the mid chest.  She feels as if a weight has been placed  there.  This typically occurs with exertion, such as performing cleaning  chores.  It is relieved after several minutes by rest.  She may have minimal  associated dyspnea, but this is not permanent.  There is no diaphoresis or  nausea.   PAST MEDICAL HISTORY:  Notable for hypothyroidism.   PAST SURGICAL HISTORY:  Surgeries have included procedures on her sinuses,  hysterectomy, cataract extraction, and thyroidectomy.   MEDICATIONS:  Levothyroxine, Fosamax, Xanax, vitamin B, and Mucinex.   She has no known allergies.   SOCIAL HISTORY:  Widowed.  Husband was former patient of mine.  She now  lives alone here in town.  She is retired.  Uses no tobacco products or  alcohol.  She walks on a regular basis.   FAMILY HISTORY:  Mother suffered a fatal CVA at age 53.  Father died in his  22s and is thought to have had heart problems.  Of two siblings, one died at  age 8, again, due to heart disease.  She has a number of uncles who  suffered a  myocardial infarction at an early age.   REVIEW OF SYSTEMS:  Notable for occasional palpitations and some dyspnea on  exertion.  All other systems reviewed and are negative.   PHYSICAL EXAMINATION:  VITAL SIGNS:  Temperature 97.9, heart rate 70 and  regular, respirations 20, blood pressure 125/75.  GENERAL:  A pleasant, trim woman in no acute distress.  HEENT:  Anicteric sclerae.  Normal lids and conjunctivae.  Pupils are equal,  round and reactive to light.  NECK:  No jugular venous distention.  Normal carotid upstrokes without  bruits.  ENDOCRINE:  No thyromegaly.  SKIN:  No significant lesions.  HEMATOPOIETIC:  No adenopathy.  LUNGS:  Resonant to percussion.  Clear to auscultation.  HEART:  Normal first and second heart sounds.  Grade 1/6 basilar systolic  ejection murmur.  ABDOMEN:  Soft and nontender.  No organomegaly.  No masses.  No bruits.  EXTREMITIES:  Distal pulses are intact.  No edema.  NEUROMUSCULAR:  Symmetric strength and tone.  Normal cranial nerves.  MUSCULOSKELETAL:  No joint deformities.   CHEST X-RAY:  Bronchitic changes.  Questionable nodular density over the  right lower lung fields.   EKG:  Normal sinus rhythm at a rate of 60.  Possible prior septal myocardial  infarction.   Other laboratory notable for a normal CBC, normal chemistry profile, normal  hepatic profile, normal D-dimer, and negative cardiac markers.   IMPRESSION:  [name] presents with symptoms that are quite worrisome for  typical exertional angina; however, her risk factors are few.  Exam  unremarkable.  Initial testing negative.  Although, proceeding directly to  directly to coronary angiography would be reasonable, it may be possible to  avoid this if a stress radionuclide study is entirely negative.  We will  proceed with stress testing in the morning.  As long as she remains  asymptomatic, she does not require additional medical therapy at present.  Nearly all of her symptoms have  suggested stable angina.  Due to relative  bradycardia, her low dose of beta blocker will be discontinued in  anticipation of exercise stress.   We greatly appreciate this request for consultation and will be happy to  follow this patient with you.      RR/MEDQ  D:  01/30/2005  T:  01/30/2005  Job:  [id number]

## 2011-02-13 NOTE — Op Note (Signed)
NAME:  [name], [name]                          ACCOUNT NO.:  [id number]   MEDICAL RECORD NO.:  [id number]                   PATIENT TYPE:  OBV   LOCATION:  5740                                 FACILITY:  [facility]   PHYSICIAN:  Lucky Cowboy, M.D.                    DATE OF BIRTH:  December 08, 1933   DATE OF PROCEDURE:  06/28/2003  DATE OF DISCHARGE:  06/29/2003                                 OPERATIVE REPORT   PREOPERATIVE DIAGNOSIS:  Thyroid goiter with dysphagia.   POSTOPERATIVE DIAGNOSIS:  Thyroid goiter with dysphagia.   PROCEDURE:  Direct laryngoscopy with total thyroidectomy.   SURGEON:  Lucky Cowboy, M.D.   ASSISTANTEnrigue Catena H. Pollyann Kennedy, M.D.   ANESTHESIA:  General endotracheal anesthesia.   ESTIMATED BLOOD LOSS:  50 mL.   SPECIMENS:  Thyroid gland.   COMPLICATIONS:  None.   INDICATIONS FOR PROCEDURE:  This patient is a 77 year old female who is  having significant dysphagia and has been evaluated considerably with pH  probe and also barium swallow.  She has been tried on proton pump inhibitors  twice daily without significant improvement.  Due to the deviation of the  esophagus and associated dysphagia, thyroidectomy is performed.  Additionally, the patient was having significant swallowing difficulty this  morning and felt like the thickening and problems in her larynx should be  evaluated and, for this reason, direct laryngoscopy was performed at the  time of intubation.   FINDINGS:  The patient was noted to have a normal endolaryngeal exam.  She  did have a substantially enlarged thyroid gland without discrete nodule or  growth.   PROCEDURE:  The patient was taken to the operating room and placed on the  table in supine position.  She was then placed under general endotracheal  anesthesia and the table rotated counter clockwise 90 degrees.  A  laryngoscope was placed intraorally with protection of the upper incisors  using the mouth guard.  The endolarynx was carefully  looked at as was the  post cricoid and piriform sinus regions.  There were no abnormalities noted.  At this point, a shoulder roll was placed and the neck prepped with Betadine  and draped in the usual sterile fashion.  Prior to this, the patient's table  was rotated back to its original position.  A transverse collar incision was  made of approximately 7 cm about 2 fingerbreadths superior to the thyroid  notch and clavicles.  This was made using a #15 blade.  The subcutaneous  tissues and platysmal muscle were divided using Bovie cautery and  subplatysmal flaps elevated superior to the thyroid prominence and  inferiorly to the clavicles.  The strap muscles were divided in the median  raphe and reflected off the thyroid gland using Bovie cautery and  dissection.  The vessels when encountered, were doubly clamped, divided, and  tied off with either 4-0 or 3-0  silk suture.  The thyroid gland was then  carefully elevated off the surrounding strap musculature.  The left gland  was performed first.  As the dissection continued, superior pole vessels  were released.  Care was used to stay immediately on the gland and clamp  vessels.  The parathyroid glands were left in position so as not to disturb  their blood supply.  The recurrent laryngeal nerve was left in its position  and was identified and protected during its course.  In this way, the  thyroid gland was lifted out and dissected free from the anterior wall of  the trachea and the right thyroid lobe was dissected in an identical  fashion, again protecting the recurrent laryngeal  nerve.  After removal of  the gland, it was sent to pathology.  The wound was copiously irrigated with  normal saline.  A drain was placed in the depths of the wound and brought  out through the incision and secured to the neck in a simple interrupted  fashion using 3-0 nylon.  4-0 Vicryl was used to reapproximate the strap  muscles and the median raphe in a  simple interrupted fashion.  Similarly,  the platysma was reapproximated in a simple interrupted buried fashion with  4-0 Vicryl.  The skin was closed in a running stitch using 4-0 Prolene.  Bacitracin ointment was applied.  The patient was awakened from anesthesia  and taken to the post anesthesia unit in stable condition.  There were no  complications.                                               Lucky Cowboy, M.D.    SJ/MEDQ  D:  08/15/2003  T:  08/16/2003  Job:  [id number]

## 2011-04-17 ENCOUNTER — Emergency Department (HOSPITAL_COMMUNITY): Payer: Medicare Other

## 2011-04-17 ENCOUNTER — Other Ambulatory Visit: Payer: Self-pay

## 2011-04-17 ENCOUNTER — Emergency Department (HOSPITAL_COMMUNITY)
Admission: EM | Admit: 2011-04-17 | Discharge: 2011-04-17 | Disposition: A | Discharge status: Home / Self Care | Payer: Medicare Other | Attending: Emergency Medicine | Admitting: Emergency Medicine

## 2011-04-17 DIAGNOSIS — Z79899 Other long term (current) drug therapy: Secondary | ICD-10-CM | POA: Insufficient documentation

## 2011-04-17 DIAGNOSIS — E079 Disorder of thyroid, unspecified: Secondary | ICD-10-CM | POA: Insufficient documentation

## 2011-04-17 DIAGNOSIS — F0390 Unspecified dementia without behavioral disturbance: Secondary | ICD-10-CM

## 2011-04-17 DIAGNOSIS — I10 Essential (primary) hypertension: Secondary | ICD-10-CM | POA: Insufficient documentation

## 2011-04-17 DIAGNOSIS — R29898 Other symptoms and signs involving the musculoskeletal system: Secondary | ICD-10-CM | POA: Insufficient documentation

## 2011-04-17 DIAGNOSIS — R4182 Altered mental status, unspecified: Secondary | ICD-10-CM | POA: Insufficient documentation

## 2011-04-17 DIAGNOSIS — T50901A Poisoning by unspecified drugs, medicaments and biological substances, accidental (unintentional), initial encounter: Secondary | ICD-10-CM | POA: Insufficient documentation

## 2011-04-17 HISTORY — DX: Disorder of thyroid, unspecified: E07.9

## 2011-04-17 HISTORY — DX: Essential (primary) hypertension: I10

## 2011-04-17 LAB — URINALYSIS, ROUTINE W REFLEX MICROSCOPIC
Ketones, ur: NEGATIVE mg/dL
Nitrite: NEGATIVE
Protein, ur: NEGATIVE mg/dL

## 2011-04-17 LAB — COMPREHENSIVE METABOLIC PANEL
Alkaline Phosphatase: 55 U/L (ref 39–117)
BUN: 15 mg/dL (ref 6–23)
Chloride: 104 mEq/L (ref 96–112)
GFR calc Af Amer: >60 mL/min (ref >60)
Glucose, Bld: 76 mg/dL (ref 70–99)
Potassium: 4.1 mEq/L (ref 3.5–5.1)
Total Bilirubin: 0.6 mg/dL (ref 0.3–1.2)
Total Protein: 7 g/dL (ref 6.0–8.3)

## 2011-04-17 LAB — CBC
HCT: 37.6 % (ref 36.0–46.0)
Hemoglobin: 12.4 g/dL (ref 12.0–15.0)
MCHC: 33 g/dL (ref 30.0–36.0)
MCV: 96.2 fL (ref 78.0–100.0)

## 2011-04-17 LAB — URINE MICROSCOPIC-ADD ON

## 2011-04-17 LAB — GLUCOSE, CAPILLARY: Glucose-Capillary: 78 mg/dL (ref 70–99)

## 2011-04-17 NOTE — ED Notes (Signed)
Assisted pt to and from restroom.  Pt ambulated without difficulty.

## 2011-04-17 NOTE — ED Provider Notes (Cosign Needed)
History   Per patient's daughter she was having trouble waking up the patient today and she kept falling back onto the bed. Pt has chronic balance problems and falls a lot, but did not fall today with an injury. Daughter states she fills out the patient's pill bottles and Friday (today) and Saturdays night medications are gone which are 1/2 vicodin and 1 aprazalam each night. Pt is improving per her daughter since she has come to the ED (no treatment). The patients best friend died 3 days ago. Pt and daughter deny cough, vomiting, diarrhea, change in appetite. Has chronic constipation which is not worse.   Chief Complaint  Patient presents with  . Altered Mental Status   Patient is a 77 y.o. female presenting with altered mental status. The history is provided by the patient and a relative.  Altered Mental Status The problem has been gradually improving. Pertinent negatives include no chest pain, no abdominal pain, no headaches and no shortness of breath. The symptoms are aggravated by nothing. The symptoms are relieved by nothing. She has tried nothing for the symptoms.    Past Medical History  Diagnosis Date  . Hypertension   . Thyroid disease     Past Surgical History  Procedure Date  . Eye surgery   . Thyroidectomy 2004  . Detatched retina repain   . Abdominal hysterectomy     No family history on file.  History  Substance Use Topics  . Smoking status: Never Smoker   . Smokeless tobacco: Not on file  . Alcohol Use: No    OB History    Grav Para Term Preterm Abortions TAB SAB Ect Mult Living                  Review of Systems  Respiratory: Negative for shortness of breath.   Cardiovascular: Negative for chest pain.  Gastrointestinal: Negative for abdominal pain.  Neurological: Negative for headaches.  Psychiatric/Behavioral: Positive for altered mental status.  All other systems reviewed and are negative.    Physical Exam  BP 136/72  Pulse 61  Temp(Src) 98.3  F (36.8 C) (Oral)  Resp 12  Wt 130 lb (58.968 kg)  SpO2 100%  Physical Exam  Constitutional: Vital signs are normal. She appears well-developed.       Pt has bradycardia  HENT:  Head: Normocephalic and atraumatic.  Eyes: Conjunctivae and EOM are normal. Pupils are equal, round, and reactive to light.  Neck: Normal range of motion. Neck supple.  Cardiovascular: Regular rhythm and normal pulses.  Bradycardia present.   Pulmonary/Chest: Effort normal and breath sounds normal.  Abdominal: Soft.  Musculoskeletal: Normal range of motion.  Neurological: She is alert.  Skin: Skin is warm and dry. No rash noted.  Psychiatric: She has a normal mood and affect.    ED Course  Procedures  MDM  Date: 04/17/2011  Rate: 55  Rhythm: sinus bradycardia  QRS Axis: normal  Intervals: normal  ST/T Wave abnormalities: normal  Conduction Disutrbances:none  Narrative Interpretation: Q waves anterior leads    Old EKG Reviewed: unchanged from 06/17/10    Results for orders placed during the hospital encounter of 04/17/11  GLUCOSE, CAPILLARY      Component Value Range   Glucose-Capillary 78  70 - 99 (mg/dL)  CBC      Component Value Range   WBC 6.8  4.0 - 10.5 (K/uL)   RBC 3.91  3.87 - 5.11 (MIL/uL)   Hemoglobin 12.4  12.0 - 15.0 (g/dL)  HCT 37.6  36.0 - 46.0 (%)   MCV 96.2  78.0 - 100.0 (fL)   MCH 31.7  26.0 - 34.0 (pg)   MCHC 33.0  30.0 - 36.0 (g/dL)   RDW 16.1  09.6 - 04.5 (%)   Platelets 220  150 - 400 (K/uL)  COMPREHENSIVE METABOLIC PANEL      Component Value Range   Sodium 140  135 - 145 (mEq/L)   Potassium 4.1  3.5 - 5.1 (mEq/L)   Chloride 104  96 - 112 (mEq/L)   CO2 32  19 - 32 (mEq/L)   Glucose, Bld 76  70 - 99 (mg/dL)   BUN 15  6 - 23 (mg/dL)   Creatinine, Ser 4.09  0.50 - 1.10 (mg/dL)   Calcium 9.0  8.4 - 81.1 (mg/dL)   Total Protein 7.0  6.0 - 8.3 (g/dL)   Albumin 3.2 (*) 3.5 - 5.2 (g/dL)   AST 19  0 - 37 (U/L)   ALT 8  0 - 35 (U/L)   Alkaline Phosphatase 55  39  - 117 (U/L)   Total Bilirubin 0.6  0.3 - 1.2 (mg/dL)   GFR calc non Af Amer >60  >60 (mL/min)   GFR calc Af Amer >60  >60 (mL/min)  URINALYSIS, ROUTINE W REFLEX MICROSCOPIC      Component Value Range   Color, Urine YELLOW  YELLOW    Appearance CLEAR  CLEAR    Specific Gravity, Urine 1.015  1.005 - 1.030    pH 6.5  5.0 - 8.0    Glucose, UA NEGATIVE  NEGATIVE (mg/dL)   Hgb urine dipstick TRACE (*) NEGATIVE    Bilirubin Urine NEGATIVE  NEGATIVE    Ketones, ur NEGATIVE  NEGATIVE (mg/dL)   Protein, ur NEGATIVE  NEGATIVE (mg/dL)   Urobilinogen, UA 0.2  0.0 - 1.0 (mg/dL)   Nitrite NEGATIVE  NEGATIVE    Leukocytes, UA TRACE (*) NEGATIVE   CARDIAC PANEL(CRET KIN+CKTOT+MB+TROPI)      Component Value Range   Total CK 47  7 - 177 (U/L)   CK, MB 2.2  0.3 - 4.0 (ng/mL)   Troponin I <0.30  <0.30 (ng/mL)   Relative Index RELATIVE INDEX IS INVALID  0.0 - 2.5   URINE MICROSCOPIC-ADD ON      Component Value Range   Squamous Epithelial / LPF RARE  RARE    WBC, UA 0-2  <3 (WBC/hpf)   RBC / HPF 0-2  <3 (RBC/hpf)   Bacteria, UA RARE  RARE    Ct Head Wo Contrast  04/17/2011  *RADIOLOGY REPORT*  Clinical Data: Weakness in lower extremity  CT HEAD WITHOUT CONTRAST  Technique:  Contiguous axial images were obtained from the base of the skull through the vertex without contrast.  Comparison: Head CT 06/08/2010  Findings: No acute intracranial hemorrhage.  No focal mass lesion.  No CT evidence of acute infarction.  There is extensive periventricular and subcortical and deep white matter hypodensities consistent with small vessel ischemia.  This is not significantly changed from prior.  No midline shift or mass effect.  No hydrocephalus.  Mastoid air cells are clear.  There is partial opacification of the left sphenoid hemi sinus which is similar to prior.  Orbits appear normal.  IMPRESSION:  1.  No acute intracranial findings. 2.  Extensive white matter hypodensities  likely represents small vessel ischemia. 3.   Chronic sphenoid opacification.  Original Report Authenticated By: Genevive Bi, M.D.    10:22 PM Daughter states  patient has been confused for the past year. States patient is back to her usual self. Will take her home with her to monitor her pills, advised to discuss meds for her dementia with Dr Milford Cage.   Ward Givens, MD 04/17/11 2223

## 2011-04-17 NOTE — ED Notes (Signed)
Lethargic but responds appropriately to questions

## 2011-05-27 ENCOUNTER — Other Ambulatory Visit: Payer: Self-pay | Admitting: Family Medicine

## 2011-11-05 ENCOUNTER — Other Ambulatory Visit: Payer: Self-pay | Admitting: Family Medicine

## 2011-11-30 ENCOUNTER — Other Ambulatory Visit (HOSPITAL_COMMUNITY): Payer: Self-pay | Admitting: Pediatrics

## 2011-11-30 DIAGNOSIS — R4182 Altered mental status, unspecified: Secondary | ICD-10-CM

## 2011-11-30 DIAGNOSIS — R42 Dizziness and giddiness: Secondary | ICD-10-CM

## 2011-12-03 ENCOUNTER — Ambulatory Visit (HOSPITAL_COMMUNITY)
Admission: RE | Admit: 2011-12-03 | Discharge: 2011-12-03 | Disposition: A | Discharge status: Home / Self Care | Payer: Medicare Other | Source: Ambulatory Visit | Attending: Pediatrics | Admitting: Pediatrics

## 2011-12-03 ENCOUNTER — Other Ambulatory Visit (HOSPITAL_COMMUNITY): Payer: Self-pay | Admitting: Internal Medicine

## 2011-12-03 DIAGNOSIS — R42 Dizziness and giddiness: Secondary | ICD-10-CM

## 2011-12-03 DIAGNOSIS — R4182 Altered mental status, unspecified: Secondary | ICD-10-CM

## 2012-08-16 ENCOUNTER — Emergency Department: Payer: Self-pay | Admitting: Emergency Medicine

## 2012-08-16 LAB — DRUG SCREEN, URINE
Amphetamines, Ur Screen: NEGATIVE (ref ?–1000)
Barbiturates, Ur Screen: NEGATIVE (ref ?–200)
Benzodiazepine, Ur Scrn: NEGATIVE (ref ?–200)
MDMA (Ecstasy)Ur Screen: NEGATIVE (ref ?–500)
Methadone, Ur Screen: NEGATIVE (ref ?–300)
Opiate, Ur Screen: NEGATIVE (ref ?–300)
Phencyclidine (PCP) Ur S: NEGATIVE (ref ?–25)
Tricyclic, Ur Screen: NEGATIVE (ref ?–1000)

## 2012-08-16 LAB — COMPREHENSIVE METABOLIC PANEL
Alkaline Phosphatase: 64 U/L (ref 50–136)
Calcium, Total: 8.4 mg/dL — ABNORMAL LOW (ref 8.5–10.1)
Co2: 31 mmol/L (ref 21–32)
Glucose: 79 mg/dL (ref 65–99)
Osmolality: 280 (ref 275–301)
SGOT(AST): 29 U/L (ref 15–37)
SGPT (ALT): 22 U/L (ref 12–78)

## 2012-08-16 LAB — URINALYSIS, COMPLETE
Glucose,UR: NEGATIVE mg/dL (ref 0–75)
Ketone: NEGATIVE
Nitrite: NEGATIVE
Protein: NEGATIVE
RBC,UR: 5 /HPF (ref 0–5)
Specific Gravity: 1.005 (ref 1.003–1.030)
Squamous Epithelial: <1
WBC UR: 1 /HPF (ref 0–5)

## 2012-08-16 LAB — CBC
HCT: 40.8 % (ref 35.0–47.0)
HGB: 14 g/dL (ref 12.0–16.0)
MCH: 32.3 pg (ref 26.0–34.0)
MCHC: 34.3 g/dL (ref 32.0–36.0)
MCV: 94 fL (ref 80–100)
RDW: 14.4 % (ref 11.5–14.5)
WBC: 9.7 10*3/uL (ref 3.6–11.0)

## 2012-08-16 LAB — SALICYLATE LEVEL: Salicylates, Serum: <1.7 mg/dL

## 2012-08-16 LAB — TSH: Thyroid Stimulating Horm: 3.53 u[IU]/mL

## 2012-08-16 LAB — ETHANOL
Ethanol %: <0.003 % (ref 0.000–0.080)
Ethanol: <3 mg/dL

## 2012-09-19 ENCOUNTER — Emergency Department: Payer: Self-pay | Admitting: Emergency Medicine

## 2012-09-19 LAB — CK TOTAL AND CKMB (NOT AT ARMC): CK, Total: 26 U/L (ref 21–215)

## 2012-09-19 LAB — BASIC METABOLIC PANEL
BUN: 14 mg/dL (ref 7–18)
Calcium, Total: 9.1 mg/dL (ref 8.5–10.1)
Creatinine: 0.82 mg/dL (ref 0.60–1.30)
EGFR (Non-African Amer.): >60
Glucose: 80 mg/dL (ref 65–99)
Potassium: 3.8 mmol/L (ref 3.5–5.1)
Sodium: 139 mmol/L (ref 136–145)

## 2012-09-19 LAB — TROPONIN I
Troponin-I: <0.02 ng/mL
Troponin-I: <0.02 ng/mL

## 2012-09-19 LAB — CBC
HCT: 41.1 % (ref 35.0–47.0)
HGB: 13.7 g/dL (ref 12.0–16.0)
MCH: 31.5 pg (ref 26.0–34.0)
MCV: 95 fL (ref 80–100)
Platelet: 202 10*3/uL (ref 150–440)
RDW: 14.4 % (ref 11.5–14.5)
WBC: 9.5 10*3/uL (ref 3.6–11.0)

## 2012-11-22 ENCOUNTER — Emergency Department: Payer: Self-pay | Admitting: Emergency Medicine

## 2012-11-22 LAB — CBC
HCT: 41.4 % (ref 35.0–47.0)
HGB: 13.7 g/dL (ref 12.0–16.0)
MCV: 96 fL (ref 80–100)
Platelet: 204 10*3/uL (ref 150–440)
RDW: 13.8 % (ref 11.5–14.5)

## 2012-11-22 LAB — TROPONIN I
Troponin-I: <0.02 ng/mL
Troponin-I: <0.02 ng/mL

## 2012-11-22 LAB — CK TOTAL AND CKMB (NOT AT ARMC): CK, Total: 48 U/L (ref 21–215)

## 2012-11-22 LAB — BASIC METABOLIC PANEL
BUN: 15 mg/dL (ref 7–18)
Calcium, Total: 8.7 mg/dL (ref 8.5–10.1)
Glucose: 59 mg/dL — ABNORMAL LOW (ref 65–99)
Sodium: 141 mmol/L (ref 136–145)

## 2013-12-19 ENCOUNTER — Ambulatory Visit (INDEPENDENT_AMBULATORY_CARE_PROVIDER_SITE_OTHER): Payer: 59 | Admitting: Psychiatry

## 2013-12-19 ENCOUNTER — Encounter (HOSPITAL_COMMUNITY): Payer: Self-pay | Admitting: Psychiatry

## 2013-12-19 VITALS — BP 110/80 | Ht 67.0 in | Wt 146.0 lb

## 2013-12-19 DIAGNOSIS — F3160 Bipolar disorder, current episode mixed, unspecified: Secondary | ICD-10-CM

## 2013-12-19 DIAGNOSIS — F0391 Unspecified dementia with behavioral disturbance: Secondary | ICD-10-CM

## 2013-12-19 DIAGNOSIS — F03918 Unspecified dementia, unspecified severity, with other behavioral disturbance: Secondary | ICD-10-CM

## 2013-12-19 DIAGNOSIS — F3162 Bipolar disorder, current episode mixed, moderate: Secondary | ICD-10-CM

## 2013-12-19 MED ORDER — ALPRAZOLAM 0.25 MG PO TABS: 0.2500 mg | ORAL_TABLET | Freq: Three times a day (TID) | ORAL | Status: DC

## 2013-12-19 MED ORDER — OLANZAPINE 10 MG PO TBDP: 10.0000 mg | ORAL_TABLET | Freq: Every day | ORAL | Status: DC
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
## 2014-01-06 ENCOUNTER — Observation Stay: Payer: Self-pay | Admitting: Internal Medicine

## 2014-01-06 LAB — URINALYSIS, COMPLETE
Bilirubin,UR: NEGATIVE
Glucose,UR: NEGATIVE mg/dL (ref 0–75)
KETONE: NEGATIVE
Nitrite: NEGATIVE
PH: 6 (ref 4.5–8.0)
PROTEIN: NEGATIVE
RBC,UR: 26 /HPF (ref 0–5)
Specific Gravity: 1.016 (ref 1.003–1.030)
WBC UR: 35 /HPF (ref 0–5)

## 2014-01-06 LAB — CBC WITH DIFFERENTIAL/PLATELET
BASOS ABS: 0.1 10*3/uL (ref 0.0–0.1)
Basophil %: 1.2 %
EOS ABS: 0.1 10*3/uL (ref 0.0–0.7)
EOS PCT: 1.1 %
HCT: 42.2 % (ref 35.0–47.0)
HGB: 13.8 g/dL (ref 12.0–16.0)
LYMPHS ABS: 2.2 10*3/uL (ref 1.0–3.6)
Lymphocyte %: 18.3 %
MCH: 30.9 pg (ref 26.0–34.0)
MCHC: 32.6 g/dL (ref 32.0–36.0)
MCV: 95 fL (ref 80–100)
MONOS PCT: 14.1 %
Monocyte #: 1.7 x10 3/mm — ABNORMAL HIGH (ref 0.2–0.9)
Neutrophil #: 7.7 10*3/uL — ABNORMAL HIGH (ref 1.4–6.5)
Neutrophil %: 65.3 %
Platelet: 236 10*3/uL (ref 150–440)
RBC: 4.45 10*6/uL (ref 3.80–5.20)
RDW: 14.4 % (ref 11.5–14.5)
WBC: 11.9 10*3/uL — AB (ref 3.6–11.0)

## 2014-01-06 LAB — TROPONIN I

## 2014-01-06 LAB — BASIC METABOLIC PANEL
ANION GAP: 3 — AB (ref 7–16)
BUN: 16 mg/dL (ref 7–18)
CALCIUM: 8.7 mg/dL (ref 8.5–10.1)
CREATININE: 0.93 mg/dL (ref 0.60–1.30)
Chloride: 107 mmol/L (ref 98–107)
Co2: 30 mmol/L (ref 21–32)
EGFR (African American): >60
GFR CALC NON AF AMER: 59 — AB
Glucose: 92 mg/dL (ref 65–99)
OSMOLALITY: 280 (ref 275–301)
POTASSIUM: 3.1 mmol/L — AB (ref 3.5–5.1)
Sodium: 140 mmol/L (ref 136–145)

## 2014-01-06 LAB — MAGNESIUM: MAGNESIUM: 2.3 mg/dL

## 2014-01-07 ENCOUNTER — Ambulatory Visit: Payer: Self-pay | Admitting: Orthopedic Surgery

## 2014-01-08 LAB — URINE CULTURE

## 2014-01-16 ENCOUNTER — Encounter (HOSPITAL_COMMUNITY): Payer: Self-pay | Admitting: Psychiatry

## 2014-01-16 ENCOUNTER — Ambulatory Visit (INDEPENDENT_AMBULATORY_CARE_PROVIDER_SITE_OTHER): Payer: 59 | Admitting: Psychiatry

## 2014-01-16 VITALS — BP 120/80 | Ht 67.0 in | Wt 151.0 lb

## 2014-01-16 DIAGNOSIS — F3160 Bipolar disorder, current episode mixed, unspecified: Secondary | ICD-10-CM

## 2014-01-16 DIAGNOSIS — F0391 Unspecified dementia with behavioral disturbance: Secondary | ICD-10-CM

## 2014-01-16 DIAGNOSIS — F03918 Unspecified dementia, unspecified severity, with other behavioral disturbance: Secondary | ICD-10-CM

## 2014-01-16 DIAGNOSIS — F3162 Bipolar disorder, current episode mixed, moderate: Secondary | ICD-10-CM

## 2014-01-16 MED ORDER — CITALOPRAM HYDROBROMIDE 10 MG PO TABS: 10.0000 mg | ORAL_TABLET | Freq: Every day | ORAL | Status: DC

## 2014-01-16 MED ORDER — OLANZAPINE 10 MG PO TBDP: 10.0000 mg | ORAL_TABLET | Freq: Every day | ORAL | Status: DC
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
## 2014-02-27 ENCOUNTER — Ambulatory Visit (INDEPENDENT_AMBULATORY_CARE_PROVIDER_SITE_OTHER): Payer: 59 | Admitting: Psychiatry

## 2014-02-27 ENCOUNTER — Encounter (HOSPITAL_COMMUNITY): Payer: Self-pay | Admitting: Psychiatry

## 2014-02-27 VITALS — BP 140/88 | Ht 67.0 in | Wt 141.0 lb

## 2014-02-27 DIAGNOSIS — F0391 Unspecified dementia with behavioral disturbance: Secondary | ICD-10-CM

## 2014-02-27 DIAGNOSIS — F3162 Bipolar disorder, current episode mixed, moderate: Secondary | ICD-10-CM

## 2014-02-27 DIAGNOSIS — F3160 Bipolar disorder, current episode mixed, unspecified: Secondary | ICD-10-CM

## 2014-02-27 DIAGNOSIS — F03918 Unspecified dementia, unspecified severity, with other behavioral disturbance: Secondary | ICD-10-CM

## 2014-02-27 MED ORDER — ALPRAZOLAM 0.25 MG PO TABS: 0.2500 mg | ORAL_TABLET | Freq: Three times a day (TID) | ORAL | Status: DC

## 2014-02-27 MED ORDER — FLUOXETINE HCL 20 MG PO CAPS: 20.0000 mg | ORAL_CAPSULE | Freq: Every day | ORAL | Status: DC

## 2014-02-27 MED ORDER — OLANZAPINE 10 MG PO TBDP: 10.0000 mg | ORAL_TABLET | Freq: Every day | ORAL | Status: DC
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
## 2014-03-05 ENCOUNTER — Emergency Department: Payer: Self-pay | Admitting: Emergency Medicine

## 2014-03-05 LAB — DRUG SCREEN, URINE
Amphetamines, Ur Screen: NEGATIVE (ref ?–1000)
BENZODIAZEPINE, UR SCRN: POSITIVE (ref ?–200)
Barbiturates, Ur Screen: NEGATIVE (ref ?–200)
COCAINE METABOLITE, UR ~~LOC~~: NEGATIVE (ref ?–300)
Cannabinoid 50 Ng, Ur ~~LOC~~: NEGATIVE (ref ?–50)
MDMA (ECSTASY) UR SCREEN: NEGATIVE (ref ?–500)
METHADONE, UR SCREEN: NEGATIVE (ref ?–300)
Opiate, Ur Screen: NEGATIVE (ref ?–300)
Phencyclidine (PCP) Ur S: NEGATIVE (ref ?–25)
TRICYCLIC, UR SCREEN: NEGATIVE (ref ?–1000)

## 2014-03-05 LAB — URINALYSIS, COMPLETE
Bilirubin,UR: NEGATIVE
GLUCOSE, UR: NEGATIVE mg/dL (ref 0–75)
NITRITE: NEGATIVE
Ph: 7 (ref 4.5–8.0)
Protein: NEGATIVE
SPECIFIC GRAVITY: 1.014 (ref 1.003–1.030)
Squamous Epithelial: 2

## 2014-03-05 LAB — TROPONIN I

## 2014-03-05 LAB — CBC
HCT: 44.1 % (ref 35.0–47.0)
HGB: 13.9 g/dL (ref 12.0–16.0)
MCH: 29.8 pg (ref 26.0–34.0)
MCHC: 31.6 g/dL — ABNORMAL LOW (ref 32.0–36.0)
MCV: 94 fL (ref 80–100)
PLATELETS: 206 10*3/uL (ref 150–440)
RBC: 4.68 10*6/uL (ref 3.80–5.20)
RDW: 14.3 % (ref 11.5–14.5)
WBC: 8.2 10*3/uL (ref 3.6–11.0)

## 2014-03-05 LAB — COMPREHENSIVE METABOLIC PANEL
ALT: 9 U/L — AB (ref 12–78)
Albumin: 3.1 g/dL — ABNORMAL LOW (ref 3.4–5.0)
Alkaline Phosphatase: 62 U/L
Anion Gap: 6 — ABNORMAL LOW (ref 7–16)
BILIRUBIN TOTAL: 0.8 mg/dL (ref 0.2–1.0)
BUN: 17 mg/dL (ref 7–18)
Calcium, Total: 8.7 mg/dL (ref 8.5–10.1)
Chloride: 104 mmol/L (ref 98–107)
Co2: 32 mmol/L (ref 21–32)
Creatinine: 0.98 mg/dL (ref 0.60–1.30)
GFR CALC NON AF AMER: 56 — AB
GLUCOSE: 87 mg/dL (ref 65–99)
OSMOLALITY: 284 (ref 275–301)
Potassium: 2.9 mmol/L — ABNORMAL LOW (ref 3.5–5.1)
SGOT(AST): 17 U/L (ref 15–37)
SODIUM: 142 mmol/L (ref 136–145)
Total Protein: 7 g/dL (ref 6.4–8.2)

## 2014-03-05 LAB — ETHANOL: Ethanol: <3 mg/dL

## 2014-03-05 LAB — SALICYLATE LEVEL

## 2014-03-05 LAB — ACETAMINOPHEN LEVEL: Acetaminophen: <2 ug/mL

## 2014-03-05 LAB — TSH: Thyroid Stimulating Horm: 0.798 u[IU]/mL

## 2014-03-27 ENCOUNTER — Emergency Department: Payer: Self-pay | Admitting: Emergency Medicine

## 2014-03-27 ENCOUNTER — Ambulatory Visit (INDEPENDENT_AMBULATORY_CARE_PROVIDER_SITE_OTHER): Payer: 59 | Admitting: Psychiatry

## 2014-03-27 ENCOUNTER — Encounter (HOSPITAL_COMMUNITY): Payer: Self-pay | Admitting: Psychiatry

## 2014-03-27 VITALS — BP 110/70 | Ht 67.0 in | Wt 134.0 lb

## 2014-03-27 DIAGNOSIS — F0390 Unspecified dementia without behavioral disturbance: Secondary | ICD-10-CM

## 2014-03-27 DIAGNOSIS — IMO0002 Reserved for concepts with insufficient information to code with codable children: Secondary | ICD-10-CM

## 2014-03-27 DIAGNOSIS — F3162 Bipolar disorder, current episode mixed, moderate: Secondary | ICD-10-CM

## 2014-03-27 DIAGNOSIS — F3160 Bipolar disorder, current episode mixed, unspecified: Secondary | ICD-10-CM

## 2014-03-27 LAB — COMPREHENSIVE METABOLIC PANEL
ALT: 13 U/L (ref 12–78)
AST: 33 U/L (ref 15–37)
Albumin: 3.4 g/dL (ref 3.4–5.0)
Alkaline Phosphatase: 81 U/L
Anion Gap: 8 (ref 7–16)
BUN: 14 mg/dL (ref 7–18)
Bilirubin,Total: 0.9 mg/dL (ref 0.2–1.0)
Calcium, Total: 9.7 mg/dL (ref 8.5–10.1)
Chloride: 101 mmol/L (ref 98–107)
Co2: 29 mmol/L (ref 21–32)
Creatinine: 1.17 mg/dL (ref 0.60–1.30)
EGFR (African American): 52 — ABNORMAL LOW
EGFR (Non-African Amer.): 45 — ABNORMAL LOW
GLUCOSE: 87 mg/dL (ref 65–99)
Osmolality: 276 (ref 275–301)
Potassium: 3.1 mmol/L — ABNORMAL LOW (ref 3.5–5.1)
SODIUM: 138 mmol/L (ref 136–145)
TOTAL PROTEIN: 7.9 g/dL (ref 6.4–8.2)

## 2014-03-27 LAB — ETHANOL
Ethanol %: <0.003 % (ref 0.000–0.080)
Ethanol: <3 mg/dL

## 2014-03-27 LAB — DRUG SCREEN, URINE
Amphetamines, Ur Screen: NEGATIVE (ref ?–1000)
BARBITURATES, UR SCREEN: NEGATIVE (ref ?–200)
Benzodiazepine, Ur Scrn: POSITIVE (ref ?–200)
Cannabinoid 50 Ng, Ur ~~LOC~~: NEGATIVE (ref ?–50)
Cocaine Metabolite,Ur ~~LOC~~: NEGATIVE (ref ?–300)
MDMA (ECSTASY) UR SCREEN: NEGATIVE (ref ?–500)
METHADONE, UR SCREEN: NEGATIVE (ref ?–300)
OPIATE, UR SCREEN: NEGATIVE (ref ?–300)
Phencyclidine (PCP) Ur S: NEGATIVE (ref ?–25)
Tricyclic, Ur Screen: NEGATIVE (ref ?–1000)

## 2014-03-27 LAB — URINALYSIS, COMPLETE
Bilirubin,UR: NEGATIVE
Glucose,UR: NEGATIVE mg/dL (ref 0–75)
NITRITE: NEGATIVE
Ph: 6 (ref 4.5–8.0)
Protein: 100
RBC,UR: 10 /HPF (ref 0–5)
Specific Gravity: 1.015 (ref 1.003–1.030)
Squamous Epithelial: 2

## 2014-03-27 LAB — ACETAMINOPHEN LEVEL: Acetaminophen: <2 ug/mL

## 2014-03-27 LAB — CBC
HCT: 47.4 % — AB (ref 35.0–47.0)
HGB: 15.4 g/dL (ref 12.0–16.0)
MCH: 30.2 pg (ref 26.0–34.0)
MCHC: 32.5 g/dL (ref 32.0–36.0)
MCV: 93 fL (ref 80–100)
Platelet: 210 10*3/uL (ref 150–440)
RBC: 5.1 10*6/uL (ref 3.80–5.20)
RDW: 14.5 % (ref 11.5–14.5)
WBC: 11.3 10*3/uL — ABNORMAL HIGH (ref 3.6–11.0)

## 2014-03-27 LAB — SALICYLATE LEVEL: Salicylates, Serum: <1.7 mg/dL
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
## 2014-04-10 ENCOUNTER — Encounter (HOSPITAL_COMMUNITY): Payer: Self-pay | Admitting: Psychiatry

## 2014-04-10 ENCOUNTER — Ambulatory Visit (INDEPENDENT_AMBULATORY_CARE_PROVIDER_SITE_OTHER): Payer: 59 | Admitting: Psychiatry

## 2014-04-10 VITALS — BP 120/80 | Ht 67.0 in | Wt 138.0 lb

## 2014-04-10 DIAGNOSIS — F3162 Bipolar disorder, current episode mixed, moderate: Secondary | ICD-10-CM

## 2014-04-10 MED ORDER — MIRTAZAPINE 30 MG PO TABS: 30.0000 mg | ORAL_TABLET | Freq: Every day | ORAL | Status: DC

## 2014-04-10 MED ORDER — FLUOXETINE HCL 40 MG PO CAPS: 40.0000 mg | ORAL_CAPSULE | Freq: Every day | ORAL | Status: DC
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
## 2014-05-01 ENCOUNTER — Emergency Department: Payer: Self-pay | Admitting: Emergency Medicine

## 2014-05-01 LAB — BASIC METABOLIC PANEL
Anion Gap: 7 (ref 7–16)
BUN: 19 mg/dL — AB (ref 7–18)
Calcium, Total: 8.5 mg/dL (ref 8.5–10.1)
Chloride: 106 mmol/L (ref 98–107)
Co2: 32 mmol/L (ref 21–32)
Creatinine: 0.97 mg/dL (ref 0.60–1.30)
EGFR (African American): >60
EGFR (Non-African Amer.): 56 — ABNORMAL LOW
Glucose: 84 mg/dL (ref 65–99)
Osmolality: 290 (ref 275–301)
Potassium: 3.1 mmol/L — ABNORMAL LOW (ref 3.5–5.1)
Sodium: 145 mmol/L (ref 136–145)

## 2014-05-01 LAB — CBC WITH DIFFERENTIAL/PLATELET
BASOS ABS: 0.1 10*3/uL (ref 0.0–0.1)
Basophil %: 1.1 %
EOS ABS: 0.2 10*3/uL (ref 0.0–0.7)
EOS PCT: 3 %
HCT: 44.9 % (ref 35.0–47.0)
HGB: 14.3 g/dL (ref 12.0–16.0)
LYMPHS ABS: 1.5 10*3/uL (ref 1.0–3.6)
Lymphocyte %: 26.3 %
MCH: 30.3 pg (ref 26.0–34.0)
MCHC: 31.9 g/dL — ABNORMAL LOW (ref 32.0–36.0)
MCV: 95 fL (ref 80–100)
Monocyte #: 0.7 x10 3/mm (ref 0.2–0.9)
Monocyte %: 11.2 %
Neutrophil #: 3.4 10*3/uL (ref 1.4–6.5)
Neutrophil %: 58.4 %
Platelet: 213 10*3/uL (ref 150–440)
RBC: 4.73 10*6/uL (ref 3.80–5.20)
RDW: 15.3 % — ABNORMAL HIGH (ref 11.5–14.5)
WBC: 5.8 10*3/uL (ref 3.6–11.0)

## 2014-05-01 LAB — URINALYSIS, COMPLETE
BILIRUBIN, UR: NEGATIVE
Bacteria: NONE SEEN
GLUCOSE, UR: NEGATIVE mg/dL (ref 0–75)
Ketone: NEGATIVE
LEUKOCYTE ESTERASE: NEGATIVE
Nitrite: NEGATIVE
PH: 7 (ref 4.5–8.0)
Protein: NEGATIVE
RBC,UR: 84 /HPF (ref 0–5)
Specific Gravity: 1.015 (ref 1.003–1.030)
Squamous Epithelial: NONE SEEN
WBC UR: 1 /HPF (ref 0–5)

## 2014-05-01 LAB — HEPATIC FUNCTION PANEL A (ARMC)
ALK PHOS: 66 U/L
Albumin: 2.9 g/dL — ABNORMAL LOW (ref 3.4–5.0)
BILIRUBIN DIRECT: 0.2 mg/dL (ref 0.00–0.20)
Bilirubin,Total: 0.6 mg/dL (ref 0.2–1.0)
SGOT(AST): 25 U/L (ref 15–37)
SGPT (ALT): 12 U/L — ABNORMAL LOW
Total Protein: 7.2 g/dL (ref 6.4–8.2)

## 2014-05-01 LAB — TSH: Thyroid Stimulating Horm: 0.902 u[IU]/mL

## 2014-05-01 LAB — TROPONIN I

## 2014-05-03 LAB — URINE CULTURE

## 2014-05-08 ENCOUNTER — Ambulatory Visit (INDEPENDENT_AMBULATORY_CARE_PROVIDER_SITE_OTHER): Payer: 59 | Admitting: Psychiatry

## 2014-05-08 ENCOUNTER — Encounter (HOSPITAL_COMMUNITY): Payer: Self-pay | Admitting: Psychiatry

## 2014-05-08 VITALS — BP 129/80 | HR 85 | Ht 67.0 in | Wt 131.8 lb

## 2014-05-08 DIAGNOSIS — F03918 Unspecified dementia, unspecified severity, with other behavioral disturbance: Secondary | ICD-10-CM

## 2014-05-08 DIAGNOSIS — F3162 Bipolar disorder, current episode mixed, moderate: Secondary | ICD-10-CM

## 2014-05-08 DIAGNOSIS — F3160 Bipolar disorder, current episode mixed, unspecified: Secondary | ICD-10-CM

## 2014-05-08 DIAGNOSIS — F0391 Unspecified dementia with behavioral disturbance: Secondary | ICD-10-CM

## 2014-05-08 MED ORDER — OLANZAPINE 10 MG PO TBDP: 10.0000 mg | ORAL_TABLET | Freq: Every day | ORAL | Status: DC
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
## 2014-05-28 ENCOUNTER — Observation Stay: Payer: Self-pay | Admitting: Internal Medicine

## 2014-05-28 LAB — COMPREHENSIVE METABOLIC PANEL
ALBUMIN: 2.8 g/dL — AB (ref 3.4–5.0)
ALT: 13 U/L — AB
Alkaline Phosphatase: 81 U/L
Anion Gap: 5 — ABNORMAL LOW (ref 7–16)
BILIRUBIN TOTAL: 0.4 mg/dL (ref 0.2–1.0)
BUN: 32 mg/dL — ABNORMAL HIGH (ref 7–18)
CALCIUM: 10.2 mg/dL — AB (ref 8.5–10.1)
CHLORIDE: 107 mmol/L (ref 98–107)
CREATININE: 1.13 mg/dL (ref 0.60–1.30)
Co2: 35 mmol/L — ABNORMAL HIGH (ref 21–32)
EGFR (Non-African Amer.): 47 — ABNORMAL LOW
GFR CALC AF AMER: 54 — AB
Glucose: 99 mg/dL (ref 65–99)
Osmolality: 299 (ref 275–301)
Potassium: 3.9 mmol/L (ref 3.5–5.1)
SGOT(AST): 22 U/L (ref 15–37)
Sodium: 147 mmol/L — ABNORMAL HIGH (ref 136–145)
TOTAL PROTEIN: 8 g/dL (ref 6.4–8.2)

## 2014-05-28 LAB — URINALYSIS, COMPLETE
BACTERIA: NONE SEEN
Bilirubin,UR: NEGATIVE
Glucose,UR: NEGATIVE mg/dL (ref 0–75)
Ketone: NEGATIVE
Nitrite: NEGATIVE
PH: 7 (ref 4.5–8.0)
Protein: 100
SQUAMOUS EPITHELIAL: NONE SEEN
Specific Gravity: 1.014 (ref 1.003–1.030)

## 2014-05-28 LAB — CBC
HCT: 46 % (ref 35.0–47.0)
HGB: 14.9 g/dL (ref 12.0–16.0)
MCH: 30.7 pg (ref 26.0–34.0)
MCHC: 32.4 g/dL (ref 32.0–36.0)
MCV: 95 fL (ref 80–100)
Platelet: 385 10*3/uL (ref 150–440)
RBC: 4.85 10*6/uL (ref 3.80–5.20)
RDW: 15.8 % — ABNORMAL HIGH (ref 11.5–14.5)
WBC: 14.4 10*3/uL — ABNORMAL HIGH (ref 3.6–11.0)

## 2014-05-28 LAB — TROPONIN I
TROPONIN-I: 0.07 ng/mL — AB
TROPONIN-I: 0.08 ng/mL — AB
Troponin-I: 0.08 ng/mL — ABNORMAL HIGH

## 2014-05-29 ENCOUNTER — Ambulatory Visit: Payer: Self-pay | Admitting: Internal Medicine

## 2014-05-29 LAB — BASIC METABOLIC PANEL
Anion Gap: 5 — ABNORMAL LOW (ref 7–16)
BUN: 28 mg/dL — AB (ref 7–18)
Calcium, Total: 8.3 mg/dL — ABNORMAL LOW (ref 8.5–10.1)
Chloride: 113 mmol/L — ABNORMAL HIGH (ref 98–107)
Co2: 30 mmol/L (ref 21–32)
Creatinine: 0.92 mg/dL (ref 0.60–1.30)
EGFR (African American): >60
EGFR (Non-African Amer.): >60
GLUCOSE: 86 mg/dL (ref 65–99)
Osmolality: 299 (ref 275–301)
Potassium: 3.2 mmol/L — ABNORMAL LOW (ref 3.5–5.1)
Sodium: 148 mmol/L — ABNORMAL HIGH (ref 136–145)

## 2014-05-29 LAB — CBC WITH DIFFERENTIAL/PLATELET
BASOS PCT: 1.2 %
Basophil #: 0.1 10*3/uL (ref 0.0–0.1)
EOS ABS: 0.4 10*3/uL (ref 0.0–0.7)
EOS PCT: 3.2 %
HCT: 38.8 % (ref 35.0–47.0)
HGB: 12.3 g/dL (ref 12.0–16.0)
Lymphocyte #: 1.8 10*3/uL (ref 1.0–3.6)
Lymphocyte %: 15.2 %
MCH: 30.2 pg (ref 26.0–34.0)
MCHC: 31.7 g/dL — ABNORMAL LOW (ref 32.0–36.0)
MCV: 95 fL (ref 80–100)
MONO ABS: 1.1 x10 3/mm — AB (ref 0.2–0.9)
MONOS PCT: 9.5 %
NEUTROS ABS: 8.4 10*3/uL — AB (ref 1.4–6.5)
Neutrophil %: 70.9 %
Platelet: 351 10*3/uL (ref 150–440)
RBC: 4.08 10*6/uL (ref 3.80–5.20)
RDW: 15.3 % — AB (ref 11.5–14.5)
WBC: 11.8 10*3/uL — AB (ref 3.6–11.0)

## 2014-05-30 ENCOUNTER — Encounter: Payer: Self-pay | Admitting: Internal Medicine

## 2014-05-30 LAB — BASIC METABOLIC PANEL
Anion Gap: 5 — ABNORMAL LOW (ref 7–16)
BUN: 19 mg/dL — ABNORMAL HIGH (ref 7–18)
Calcium, Total: 7.7 mg/dL — ABNORMAL LOW (ref 8.5–10.1)
Chloride: 113 mmol/L — ABNORMAL HIGH (ref 98–107)
Co2: 28 mmol/L (ref 21–32)
Creatinine: 0.81 mg/dL (ref 0.60–1.30)
EGFR (African American): >60
GLUCOSE: 72 mg/dL (ref 65–99)
Osmolality: 291 (ref 275–301)
Potassium: 3.4 mmol/L — ABNORMAL LOW (ref 3.5–5.1)
Sodium: 146 mmol/L — ABNORMAL HIGH (ref 136–145)

## 2014-05-31 LAB — CBC WITH DIFFERENTIAL/PLATELET
Basophil #: 0.2 10*3/uL — ABNORMAL HIGH (ref 0.0–0.1)
Basophil %: 1.4 %
EOS PCT: 2.7 %
Eosinophil #: 0.3 10*3/uL (ref 0.0–0.7)
HCT: 37.4 % (ref 35.0–47.0)
HGB: 12 g/dL (ref 12.0–16.0)
Lymphocyte #: 2 10*3/uL (ref 1.0–3.6)
Lymphocyte %: 15.7 %
MCH: 30.5 pg (ref 26.0–34.0)
MCHC: 32.1 g/dL (ref 32.0–36.0)
MCV: 95 fL (ref 80–100)
Monocyte #: 1.3 x10 3/mm — ABNORMAL HIGH (ref 0.2–0.9)
Monocyte %: 10.2 %
Neutrophil #: 9.1 10*3/uL — ABNORMAL HIGH (ref 1.4–6.5)
Neutrophil %: 70 %
PLATELETS: 336 10*3/uL (ref 150–440)
RBC: 3.95 10*6/uL (ref 3.80–5.20)
RDW: 15.2 % — ABNORMAL HIGH (ref 11.5–14.5)
WBC: 13.1 10*3/uL — AB (ref 3.6–11.0)

## 2014-05-31 LAB — BASIC METABOLIC PANEL
ANION GAP: 6 — AB (ref 7–16)
BUN: 16 mg/dL (ref 7–18)
CHLORIDE: 110 mmol/L — AB (ref 98–107)
Calcium, Total: 8 mg/dL — ABNORMAL LOW (ref 8.5–10.1)
Co2: 26 mmol/L (ref 21–32)
Creatinine: 0.83 mg/dL (ref 0.60–1.30)
EGFR (African American): >60
EGFR (Non-African Amer.): >60
GLUCOSE: 87 mg/dL (ref 65–99)
OSMOLALITY: 284 (ref 275–301)
POTASSIUM: 3.6 mmol/L (ref 3.5–5.1)
Sodium: 142 mmol/L (ref 136–145)

## 2014-05-31 LAB — URINE CULTURE

## 2014-06-28 ENCOUNTER — Encounter: Payer: Self-pay | Admitting: Internal Medicine

## 2014-06-28 ENCOUNTER — Ambulatory Visit: Payer: Self-pay | Admitting: Internal Medicine

## 2014-07-09 ENCOUNTER — Ambulatory Visit (HOSPITAL_COMMUNITY): Payer: Self-pay | Admitting: Psychiatry

## 2014-07-16 ENCOUNTER — Ambulatory Visit (INDEPENDENT_AMBULATORY_CARE_PROVIDER_SITE_OTHER): Payer: 59 | Admitting: Psychiatry

## 2014-07-16 ENCOUNTER — Encounter (HOSPITAL_COMMUNITY): Payer: Self-pay | Admitting: Psychiatry

## 2014-07-16 VITALS — BP 118/92 | HR 81 | Ht 67.0 in | Wt 122.2 lb

## 2014-07-16 DIAGNOSIS — F03918 Unspecified dementia, unspecified severity, with other behavioral disturbance: Secondary | ICD-10-CM

## 2014-07-16 DIAGNOSIS — F0391 Unspecified dementia with behavioral disturbance: Secondary | ICD-10-CM

## 2014-07-16 DIAGNOSIS — F3162 Bipolar disorder, current episode mixed, moderate: Secondary | ICD-10-CM

## 2014-07-16 DIAGNOSIS — F3160 Bipolar disorder, current episode mixed, unspecified: Secondary | ICD-10-CM

## 2014-07-16 MED ORDER — MIRTAZAPINE 15 MG PO TABS: 15.0000 mg | ORAL_TABLET | Freq: Every day | ORAL | Status: AC

## 2014-07-16 MED ORDER — OLANZAPINE 10 MG PO TABS: 10.0000 mg | ORAL_TABLET | Freq: Every day | ORAL | Status: AC

## 2014-07-16 MED ORDER — ALPRAZOLAM 0.25 MG PO TABS: 0.2500 mg | ORAL_TABLET | Freq: Three times a day (TID) | ORAL | Status: AC

## 2014-07-16 MED ORDER — FLUOXETINE HCL 40 MG PO CAPS: 40.0000 mg | ORAL_CAPSULE | Freq: Every day | ORAL | Status: AC

## 2014-07-16 MED ORDER — METHYLPHENIDATE HCL 10 MG PO TABS: 10.0000 mg | ORAL_TABLET | Freq: Two times a day (BID) | ORAL | Status: DC

## 2014-07-16 MED ORDER — METHYLPHENIDATE HCL 10 MG PO TABS: 10.0000 mg | ORAL_TABLET | Freq: Two times a day (BID) | ORAL | Status: AC
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
## 2014-07-29 ENCOUNTER — Encounter: Payer: Self-pay | Admitting: Internal Medicine

## 2014-07-31 ENCOUNTER — Telehealth (HOSPITAL_COMMUNITY): Payer: Self-pay | Admitting: *Deleted

## 2014-07-31 NOTE — Telephone Encounter (Signed)
Pt daughter Lockie Mola(Cheryl Simmons calling wanting refills for her pt Olanzapine. Informed daughter per our records, pt medication was sent to pt pharmacy 07-16-14. Pt daughter showed understanding.

## 2014-08-28 ENCOUNTER — Encounter: Payer: Self-pay | Admitting: Internal Medicine

## 2014-09-06 ENCOUNTER — Telehealth (HOSPITAL_COMMUNITY): Payer: Self-pay | Admitting: *Deleted

## 2014-09-06 NOTE — Telephone Encounter (Signed)
Pt daughter [facility](Chryl Sharol HarnessSimmons) called at 9:56am 09-06-14 to cancel pt appt. Pt daughter states pt past away this gone Monday at [facility]. Per pt daughter, the cause of death was listed as Alzheimer's. Pt daughter number is [phone]

## 2014-09-06 NOTE — Telephone Encounter (Signed)
noted 

## 2014-09-10 ENCOUNTER — Ambulatory Visit (HOSPITAL_COMMUNITY): Payer: Self-pay | Admitting: Psychiatry

## 2014-09-17 ENCOUNTER — Ambulatory Visit (HOSPITAL_COMMUNITY): Payer: Self-pay | Admitting: Psychiatry

## 2014-09-28 ENCOUNTER — Encounter: Payer: Self-pay | Admitting: Internal Medicine

## 2014-09-28 DEATH — deceased

## 2015-01-19 NOTE — H&P (Signed)
PATIENT NAME:  [name], [name] MR#:  [id number] DATE OF BIRTH:  [birth year]  DATE OF ADMISSION:  05/28/2014  PRIMARY CARE PHYSICIAN:  Located at [facility].   CHIEF COMPLAINT:  Altered mental status and weakness.   HISTORY OF PRESENT ILLNESS:  This is a 81 year old female brought in by her daughter due to weakness and just not acting like herself. The patient has baseline of dementia, but is able to usually communicate. As per the daughter, the patient apparently was complaining of some vague neck and upper back pain this past Thursday. Since then, patient's symptoms have progressively gone downhill where she has not been eating and drinking well and she has been lethargic, and pretty much bed bound. She normally does ambulate with the help orf a walker and a cane, but for the past few days she has not even done that. Her p.o. intake has also been significantly poor over the past few days. Since patient was not even able to get out of bed today, her daughter brought her to the Emergency Room for further evaluation. In the ER, patient was noted to have urinary tract infection, also noted to be in acute renal failure. Hospitalist services were consulted for treatment and evaluation.   REVIEW OF SYSTEMS:  Otherwise unobtainable given the patient's underlying severe dementia.   PAST MEDICAL HISTORY:  Consistent with dementia, hypertension, hypothyroidism, depression, anxiety.   ALLERGIES:  THE PATIENT IS ALLERGIC TO RISPERDAL AND SEROQUEL.   SOCIAL HISTORY:  No smoking, no alcohol abuse, no illicit drug abuse, lives with her daughter.   FAMILY HISTORY:  Mother and father are both deceased; mother died from a CVA; father died from congestive heart failure.   CURRENT MEDICATIONS:  Are Xanax 0.25 mg t.i.d., Aricept 10 mg at bedtime, fluoxetine 40 mg daily, Synthroid 75 mcg daily, lisinopril 5 mg 1/2 tab daily, Remeron 30 mg daily, olanzapine 10 mg at bedtime.   PHYSICAL EXAMINATION:  VITAL SIGNS:   Presently is as follows, noted to be temperature 98, pulse 81, respirations 16, blood pressure 126/74, sats 100% on room air.  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  HEAD AND EYES AND EARS AND NOSE AND THROAT:  Atraumatic, normocephalic, extraocular muscles are intact, pupils equal and reactive to light, sclerae is anicteric, no conjunctival injection, no oropharyngeal erythema; she does have dry oral mucosa.  NECK:  Supple. There is no jugular venous distention. No bruits, no lymphadenopathy, no thyromegaly.  HEART:  Regular rate and rhythm, no murmurs, no rubs, no clicks.  LUNGS:  Clear to auscultation bilaterally. No rales or rhonchi. No wheezes.  ABDOMEN:  Soft, flat, nontender, nondistended, has good bowel sounds, no hepatosplenomegaly appreciated.  EXTREMITIES:  No evidence of cyanosis, clubbing, or peripheral edema, has +2 pedal and radial pulses bilaterally.  NEUROLOGIC:  The patient is alert, awake and oriented x 1, difficult to do a full exam given her profound dementia; she does not follow any simple commands presently. No focal motor or sensory deficits appreciated bilaterally.  SKIN:  Moist and warm with no rashes appreciated.  LYMPHATIC:  There is no cervical or axillary lymphadenopathy.   LABORATORY:  Shows a serum glucose of 99, BUN 32, creatinine 1.13, sodium 147, potassium 3.9, chloride 107, bicarbonate 35. The patient's LFTs were within normal limits, troponin 0.07, white cell count 14.4, hemoglobin 14.9, hematocrit 46, platelet count of 385,000, urinalysis shows 3+ leukocyte esterase, and 1200 white cells.   The patient did  have a CT of the head done without contrast, which showed no acute intracranial abnormalities, atrophy with small vessel chronic ischemic changes.   ASSESSMENT AND PLAN:  This is a 81 year old female with history of dementia, hypertension, hypothyroidism, depression, anxiety, who presents to the hospital with  weakness, altered mental status and noted to have a urinary tract infection.  1.  Altered mental status. This is likely metabolic encephalopathy, due to the urinary tract infection complicated with underlying dementia. I will treat the patient with IV ceftriaxone for the urinary tract infection and follow mental status. The patient's CT head is negative.  2.  Urinary tract infection. As mentioned, I will treat the patient with intravenous antibiotics with ceftriaxone, follow urine cultures.  3.  Elevated troponin. This is likely in setting of poor renal clearance from her acute renal failure, place her on off unit telemetry for 24 hours; follow serial cardiac markers, if her cardiac markers  turn positive consult cardiology.  4.  Acute renal failure, likely due to dehydration and poor p.o. intake. I will aggressively hydrate her with IV fluids, follow BUN and creatinine; hold her ACE inhibitor for now.  5.  Hypernatremia likely secondary to her dehydration and we will give patient aggressive intravenous fluids, follow sodium.  6.  Hypothyroidism. Continue Synthroid.  7.  Anxiety/depression. Continue Xanax and Prozac.   CODE STATUS:  The patient is a DO NOT INTUBATE/DO NOT RESUSCITATE. This was discussed with the patient's daughter.   TIME SPENT ON ADMISSION:  Is 50 minutes.    ____________________________ Rolly PancakeVivek J. Cherlynn KaiserSainani, MD vjs:nt D: 05/28/2014 16:40:45 ET T: 05/28/2014 16:53:42 ET JOB#: [id number]  cc: Rolly PancakeVivek J. Cherlynn KaiserSainani, MD, <Dictator> Houston SirenVIVEK J Krystan Northrop MD ELECTRONICALLY SIGNED 06/06/2014 15:53

## 2015-01-19 NOTE — Discharge Summary (Signed)
Dates of Admission and Diagnosis:  Date of Admission 06-Jan-2014   Date of Discharge 07-Jan-2014   Admitting Diagnosis radial fracture   Final Diagnosis UTI fall right radial fracture prolonged QTC   Discharge Diagnosis 1 Dementia   2 Hypothyroid   3 HTN    Chief Complaint/History of Present Illness CHIEF COMPLAINT: Mechanical fall.   HISTORY OF PRESENT ILLNESS: A 81 year old Caucasian female with history of dementia, hypothyroidism, who is presenting after a mechanical fall. History obtained mainly from daughter as the patient is demented at baseline and unable to provide any meaningful information. Her daughter describes 2 falls within the last 24 hours with associated lightheadedness described as "head swimming". No head trauma. No loss of consciousness. Apparently, she had increased weakness and generalized fatigue for the last 2 days. Her second fall, which was unwitnessed, she suffered from acute pain of her right wrist, thus prompted a visit to the emergency department. No other symptoms. No fevers, chills, chest pain, palpitations, shortness of breath. The patient denies any current symptoms.   Allergies:  Seroquel: Bradycardia  Routine Chem:  11-Apr-15 18:51   Magnesium, Serum 2.3 (1.8-2.4 THERAPEUTIC RANGE: 4-7 mg/dL TOXIC: > 10 mg/dL  -----------------------)  Glucose, Serum 92  BUN 16  Creatinine (comp) 0.93  Sodium, Serum 140  Potassium, Serum  3.1  Chloride, Serum 107  CO2, Serum 30  Calcium (Total), Serum 8.7  Anion Gap  3  Osmolality (calc) 280  eGFR (African American) >60  eGFR (Non-African American)  59 (eGFR values <65m/min/1.73 m2 may be an indication of chronic kidney disease (CKD). Calculated eGFR is useful in patients with stable renal function. The eGFR calculation will not be reliable in acutely ill patients when serum creatinine is changing rapidly. It is not useful in  patients on dialysis. The eGFR calculation may not be applicable to  patients at the low and high extremes of body sizes, pregnant women, and vegetarians.)  Cardiac:  11-Apr-15 18:51   Troponin I < 0.02 (0.00-0.05 0.05 ng/mL or less: NEGATIVE  Repeat testing in 3-6 hrs  if clinically indicated. >0.05 ng/mL: POTENTIAL  MYOCARDIAL INJURY. Repeat  testing in 3-6 hrs if  clinically indicated. NOTE: An increase or decrease  of 30% or more on serial  testing suggests a  clinically important change)  Routine UA:  11-Apr-15 19:27   Color (UA) Yellow  Clarity (UA) Hazy  Glucose (UA) Negative  Bilirubin (UA) Negative  Ketones (UA) Negative  Specific Gravity (UA) 1.016  Blood (UA) 2+  pH (UA) 6.0  Protein (UA) Negative  Nitrite (UA) Negative  Leukocyte Esterase (UA) 2+ (Result(s) reported on 06 Jan 2014 at 08:07PM.)  RBC (UA) 26 /HPF  WBC (UA) 35 /HPF  Bacteria (UA) 1+  Epithelial Cells (UA) 2 /HPF  Mucous (UA) PRESENT (Result(s) reported on 06 Jan 2014 at 08:07PM.)  Routine Hem:  11-Apr-15 18:51   WBC (CBC)  11.9  RBC (CBC) 4.45  Hemoglobin (CBC) 13.8  Hematocrit (CBC) 42.2  Platelet Count (CBC) 236  MCV 95  MCH 30.9  MCHC 32.6  RDW 14.4  Neutrophil % 65.3  Lymphocyte % 18.3  Monocyte % 14.1  Eosinophil % 1.1  Basophil % 1.2  Neutrophil #  7.7  Lymphocyte # 2.2  Monocyte #  1.7  Eosinophil # 0.1  Basophil # 0.1 (Result(s) reported on 06 Jan 2014 at 07:01PM.)   PERTINENT RADIOLOGY STUDIES: XRay:    11-Apr-15 19:23, Wrist Right Complete  Wrist Right Complete   REASON FOR  EXAM:    trauma, fall, weakness, swelling, pain, bruising.  COMMENTS:       PROCEDURE: DXR - DXR WRIST RT COMP WITH OBLIQUES  - Jan 06 2014  7:23PM     CLINICAL DATA:  Patient fell twice in the past 24 hr, with swelling  and redness over the first carpal/metacarpal joint    EXAM:  RIGHT WRIST - COMPLETE 3+ VIEW    COMPARISON:  None.    FINDINGS:  There is mild to moderate arthropathy of the first carpometacarpal  joint but no fracture in this area. There  is a vertically-oriented  linear lucency involving the the ulnar aspect of the distal radial  epiphysis extending into the radiocarpal joint. There is no evidence  of displacement.     IMPRESSION:  Nondisplaced fracture distal radial epiphysis      Electronically Signed    By: Skipper Cliche M.D.    On: 01/06/2014 19:27         Verified By: Rachael Fee, M.D.,  LabUnknown:  PACS Image    Pertinent Past History:  Pertinent Past History Dementia HTN Hypothyroid   Hospital Course:  Hospital Course 1. right radial fx: ORTHO CONSULT was obtained. There is no indication for surgery. She has a wrist splint palced and will follow up with Dr. Marry Guan in one week. As per physical therapy consult, she was okay to be discharged home. I did order Home health. 2.UTI: She was started on Rocephin. There was not urine culture obtained. She was discharged on PO Kelfex.  3. Hypothryoid on synthroid. 4. HTN: She can continue lisinopril 5. prolonged QTC: It was noted that she has a prolonged Qtx on admission. At [place] it has improved to 500, which is still prolonged. She will need to follow up with her PCP early next week for another EKG. Initally her Celexa was held, but since she has been this long term and there was no clear correlation with Celexa and Qtc for her, she can continue this unless her PCP wants to taper her off of this and start another antidepressant. 6. hypokalemia which was repleted:  7. Dementia: She was continued on Aricept.   Condition on Discharge Stable   DISCHARGE INSTRUCTIONS HOME MEDS:  Medication Reconciliation: Patient's Home Medications at Discharge:     Medication Instructions  metamucil 525 mg oral capsule  2 cap(s) orally once a day (at bedtime)   vitamin d3 1000 intl units oral tablet  1 tab(s) orally once a day   combigan 0.2%-0.5% ophthalmic solution  1 drop(s) to each eye 2 times a day   lumigan 0.01% ophthalmic solution  1 drop(s) to each eye  once a day (at bedtime)   levothyroxine 75 mcg (0.075 mg) oral tablet  1 tab(s) orally once a day   lisinopril 5 mg oral tablet  0.5 tab (2.65m) orally once a day   citalopram 40 mg oral tablet  1 tab(s) orally once a day (in the morning)   senokot 8.6 mg oral tablet  2 tabs (17.26m orally once a day (at bedtime)   aspirin low dose 81 mg oral delayed release tablet  1 tab(s) orally once a day   donepezil 10 mg oral tablet  1 tab(s) orally once a day (at bedtime)   olanzapine 10 mg oral tablet  1 tab(s) orally once a day   cephalexin 500 mg oral capsule  1 cap(s) orally 3 times a day x 10 days UTI  Physician's Instructions:  Home Health? Yes   [place]itchie Instructions assessment gait   Treatments None   Home Oxygen? No   Diet Low Sodium   Activity Limitations As tolerated   Referrals home health   Return to Work Not Applicable   Time frame for Follow Up Appointment 1-2 days  for EKG and medication review   Other Comments TIME SPENT 35 minutes   Electronic Signatures: Bettey Costa (MD)  (Signed 12-Apr-15 20:49)  Authored: ADMISSION DATE AND DIAGNOSIS, CHIEF COMPLAINT/HPI, Allergies, PERTINENT LABS, PERTINENT RADIOLOGY STUDIES, PERTINENT PAST HISTORY, HOSPITAL COURSE, DISCHARGE INSTRUCTIONS HOME MEDS, PATIENT INSTRUCTIONS   Last Updated: 12-Apr-15 20:49 by Bettey Costa (MD)

## 2015-01-19 NOTE — Consult Note (Signed)
PATIENT NAME:  [name] PolingSHREVE, [name] MR#:  [id number] DATE OF BIRTH:  April 20, 1934  DATE OF CONSULTATION:  01/07/2014  CONSULTING PHYSICIAN:  Kyra SearlesJohn F. Deretha Ertle, MD  CHIEF COMPLAINT: Right wrist pain.   HISTORY OF PRESENT ILLNESS: Ms. [name] is a 81 year old female admitted for a syncopal workup, who apparently sustained an injury to her right dominant wrist as well. An x-ray was obtained, showing a nondisplaced distal radius fracture. She complains of dull pain in the right dominant wrist. She apparently sustained 2 falls secondary to lightheadedness within the last 48 hours.   REVIEW OF SYSTEMS:   MUSCULOSKELETAL: The patient has no other complaints of pain.  SKIN: No laceration.  LYMPHATIC: Mild swelling right wrist.   PAST MEDICAL HISTORY:  Remarkable for dementia, hypothyroidism.   PAST SURGICAL HISTORY:  Noncontributory.   HOME MEDICATIONS: Include aspirin, lisinopril, escitalopram, olanzapine, Metamucil, Senokot, Combigan, Lumigan, Synthroid, and vitamin D3.   ALLERGIES: SEROQUEL.   FAMILY HISTORY: Unremarkable for coronary artery disease.   SOCIAL HISTORY: Non-tobacco and alcohol user. Currently lives with her daughter.  PHYSICAL EXAMINATION: GENERAL: A pleasant, alert, elderly female, cooperative with examination.  VITAL SIGNS: On presentation to the ward, temperature 98.1, heart rate 61, respirations 18, blood pressure 135/76, 94% pulse oximetry on room air.  LYMPHATIC EXAMINATION: Mild swelling right wrist.  SKIN EXAMINATION: Right wrist is warm and intact.  VASCULAR EXAMINATION: 2+ radial pulse right wrist.  NEUROLOGIC EXAMINATION: Motor and light touch sensation examination intact at the right hand.  HEENT: Normocephalic, atraumatic. Sclerae clear. Oral mucosa membranes are moist.  PSYCHIATRIC: Mood and affect appropriate.  MUSCULOSKELETAL EXAMINATION: Bilateral lower extremities and left upper extremity nontender to palpation, without deformity. Evaluation of the right upper  extremity reveals focal tenderness to palpation over the right distal radius. The distal ulna is nontender to palpation. There is no deformity in the right upper extremity.   DATA:  Radiographs of the right wrist demonstrate a nondisplaced distal radius fracture.   IMPRESSION: Closed nondisplaced right dominant distal radius fracture.   PLAN: A short arm splint has been placed. She will see Dr. Ernest PineHooten in the office in approximately one week, or sooner if questions, concerns or problems. We will proceed with nonoperative treatment.   I will be signing off. Please contact us if there are further questions.   ____________________________ Kyra SearlesJohn F. Ronn Smolinsky, MD jfs:mr D: 01/07/2014 15:16:23 ET T: 01/07/2014 18:39:16 ET JOB#: [id number]  cc: Kyra SearlesJohn F. Jru Pense, MD, <Dictator> Kyra SearlesJOHN F Kandon Hosking MD ELECTRONICALLY SIGNED 02/13/2014 23:11

## 2015-01-19 NOTE — Discharge Summary (Signed)
PATIENT NAME:  [name], [name] MR#:  [id number] DATE OF BIRTH:  1934-02-14  DATE OF ADMISSION:  05/28/2014 DATE OF DISCHARGE: 06/01/2014   ADMITTING PHYSICIAN: Rolly Pancake. Cherlynn Kaiser, MD  PRIMARY CARE PHYSICIAN: [facility]   ADMITTING DIAGNOSIS: Altered mental status.   ASSOCIATED DIAGNOSES:  1.  Metabolic encephalopathy.  2.  Urinary tract infection with enterococcus.  3.  Acute renal failure.  4.  Hypernatremia.  5.  Hypothyroidism.  6.  Anxiety and depression.  7.  Dementia.  8.  Possible paroxysmal atrial fibrillation.   CONSULTATIONS: Palliative care, Dr. Harriett Sine Phifer, was consulted on this patient.   PROCEDURES:  1.  CT scan of the head without contrast performed on 05/28/2014 shows generalized atrophy, chronic small vessel ischemic changes. No acute change.  2.  Urine culture shows 40,000 colony-forming units per milliliter of enterococcus.   HISTORY OF PRESENT ILLNESS: This very pleasant 81 year old woman with baseline dementia, presents to the Emergency Room due to weakness and change in behavior. At her baseline, she is able to communicate. On presentation, she was lethargic and noncommunicative. At baseline, she is able to ambulate with the assistance of a walker and cane. On presentation, she has been bedbound for several days. On presentation to the Emergency Room, she was noted to have a urinary tract infection and be in acute renal failure.   HOSPITAL COURSE BY PROBLEM:  1.  Metabolic encephalopathy due to urinary tract infection: The patient is back to her baseline mental status after treatment of urinary tract infection and hydration. Unfortunately, she remains diffusely weak and is in need of continued physical therapy. She is being discharged to a skilled nursing facility for continued inpatient physical therapy.  2.  Urinary tract infection: Urine cultures grew Enterococcus. She was initially treated with Rocephin, which would not cover for Enterococcus. Amoxicillin was  started on 05/31/2014 and should be continued for the next 8 days until 06/01/2014 for a 10-day course of treatment.  3.  Acute renal failure on presentation. Creatinine was 1.3, giving her a GFR of 47. Upon discharge, after hydration, creatinine is decreased to 0.83, giving her normal renal function.  4.  Hypernatremia. On presentation, sodium was 147. On discharge, after hydration, sodium was down to 142, which is normal.  5.  Possible atrial fibrillation: On the morning of 06/01/2014, a short episode of possible atrial fibrillation was noted on telemetry. Currently in normal sinus rhythm. We have started aspirin for prevention of embolic events. She is a poor candidate for anticoagulation.  Rate has been controlled throughout admission; no additional rate controlling agents are needed.  6.  Hypothyroidism. Continue Synthroid.  7.  Anxiety and depression. Continue Xanax and Prozac.  8.  Dementia: CT shows chronic microvascular change and atrophy. She was diagnosed with Alzheimer's and vascular dementia by Dr. Clelia Croft, and possibly also Lewy body dementia. During this admission, she was started on Ritalin, which may have increased her level of alertness. She is being discharged at her baseline mental function.  9.  Do Not Resuscitate.    PHYSICAL EXAMINATION:  VITAL SIGNS: Temperature 97.4, pulse 85, respirations 18, blood pressure 123/77, oxygenation 95% on room air.  GENERAL: Alert, in no distress. RESPIRATORY: Lungs are clear to auscultation bilaterally with good air movement.  CARDIOVASCULAR: Regular rate and rhythm; no murmurs, rubs or gallops.  ABDOMEN: Bowel sounds are positive. Abdomen is soft, nontender, nondistended.  EXTREMITIES: There is no edema. Peripheral pulses are 1+.  NEUROLOGIC: Cranial nerves II through XII are  grossly intact. Nonfocal neurologic exam. The patient is alert, oriented to person and place; not oriented to current situation.  PSYCHIATRIC: The patient is calm. She has  no insight into her current situation.   MOST RECENT LABORATORIES: Sodium 142, potassium 3.6, chloride 110, bicarbonate 26, BUN 16, creatinine 0.8, white blood cells 13.1, hemoglobin 12.0, platelets 336,000. MCV 95. Troponin noted to be slightly elevated at 0.08 during admission.   DISPOSITION: The patient is being discharged to skilled nursing for continued physical therapy.   DISCHARGE MEDICATIONS:  1.  Levothyroxine 75 mcg 1 tablet daily.  2.  Donepezil 10 mg 1 tablet daily at bedtime. 3.  Alprazolam 0.25 mg 1 tablet 3 times a day as needed for anxiety.  4.  Fluoxetine 40 mg 1 tablet daily.  5.  Olanzapine 10 mg disintegrating tablet, 1 tablet once a day at bedtime.  6.  Mirtazapine 30 mg 1 tablet once a day at bedtime.  7.  Methylphenidate 5 mg 1 tablet once a day.  8.  Amoxicillin 500 mg 1 tablet 3 times a day until 06/09/2014 and then stop.  9.  Aspirin 325 mg 1 tablet once a day.   DISCHARGE INSTRUCTIONS: Discharged to SNF.  DIET: Soft, mechanical, heart healthy diet.   ACTIVITY: Per physical therapy.   FOLLOWUP: The patient should follow up with her primary care physician in the next 1 to 2 weeks.   TIME SPENT ON THIS DISCHARGE: 45 minutes     ____________________________ Ena Dawleyatherine P. Clent RidgesWalsh, MD cpw:MT D: 06/01/2014 11:04:00 ET T: 06/01/2014 11:53:08 ET JOB#: [id number]  cc: Santina Evansatherine P. Clent RidgesWalsh, MD, <Dictator> Gale JourneyATHERINE P WALSH MD ELECTRONICALLY SIGNED 06/02/2014 15:02

## 2015-01-19 NOTE — Consult Note (Signed)
PATIENT NAME:  [name] BurkittSHREVE, [name] D MR#:  [id number] DATE OF BIRTH:  11/14/1933  DATE OF CONSULTATION:  03/28/2014  REFERRING PHYSICIAN:   CONSULTING PHYSICIAN:  Ayako Tapanes K. Guss Bundehalla, MD  PLACE OF DICTATION: [facility], [place] [employer] [place].  AGE: 81 years.  SEX:  Female.  RACE: White.  SUBJECTIVE:  Patient was seen in consultation in [facility].  Patient is a 81 year old white female who is a very poor historian and very little history is obtained.  Patient reported that she is 81 years old and she is married for many years and husband is as old as she is.  According to information obtained from the chart, she has been depressed and has diagnosed dementia and has acting out behavior.    PAST PSYCHIATRIC HISTORY:  None available.  MENTAL STATUS:  Patient was seen lying in bed, comfortable, alert, but very confused.  She did not know where she was.  She did not know date, time, or situation.  However, she could identify objects like a pencil, paper, and a pocketbook.   Does not appear to be  responding to internal stimuli and does not appear to be suicidal or homicidal.  Insight and judgment impaired.  Impulse control poor.   IMPRESSION:  Dementia with confusion.  Recommend patient be considered for inpatient hospitalization to a structured geriatric facility for further stabilization.   ____________________________ Jannet MantisSurya K. Guss Bundehalla, MD skc:dd D: 03/28/2014 15:45:00 ET T: 03/28/2014 19:16:08 ET JOB#: [id number]  cc: Monika SalkSurya K. Guss Bundehalla, MD, <Dictator> Beau FannySURYA K Camesha Farooq MD ELECTRONICALLY SIGNED 03/31/2014 17:45

## 2015-01-19 NOTE — H&P (Signed)
PATIENT NAME:  [name], [name] MR#:  [id number] DATE OF BIRTH:  07-12-1934  DATE OF ADMISSION:  01/06/2014  REFERRING PHYSICIAN: Dr. Sharyn CreamerMark Herrera.   PRIMARY CARE PHYSICIAN: Dr. Ulanda EdisonEmma Herrera.   CHIEF COMPLAINT: Mechanical fall.   HISTORY OF PRESENT ILLNESS: A 81 year old Caucasian female with history of dementia, hypothyroidism, who is presenting after a mechanical fall. History obtained mainly from daughter as the patient is demented at baseline and unable to provide any meaningful information. Her daughter describes 2 falls within the last 24 hours with associated lightheadedness described as "head swimming". No head trauma. No loss of consciousness. Apparently, she had increased weakness and generalized fatigue for the last 2 days. Her second fall, which was unwitnessed, she suffered from acute pain of her right wrist, thus prompted a visit to the emergency department. No other symptoms. No fevers, chills, chest pain, palpitations, shortness of breath. The patient denies any current symptoms.   REVIEW OF SYSTEMS: Unable to obtain given the patient's baseline mental status; however, per daughter:  CONSTITUTIONAL: No fevers or chills. Positive fatigue, weakness.  EYES: No blurred vision. No double vision, eye pain. ENT: Denies any pain or loss.  RESPIRATORY: No cough, shortness of breath.  CARDIOVASCULAR: No chest pain, palpitations, or edema.  GASTROINTESTINAL: No nausea, vomiting, diarrhea, or abdominal pain.  GENITOURINARY: No dysuria or hematuria.  ENDOCRINE: No nocturia or thyroid problems.  HEMATOLOGIC AND LYMPHATIC: No bruising, bleeding.  SKIN: Denies rash or lesion.  MUSCULOSKELETAL: No pain in neck, back, shoulder, knees, hips, or arthritic symptoms.  NEUROLOGIC: No paralysis, paresthesias.  PSYCHIATRIC: No anxiety or depressive symptoms.   PAST MEDICAL HISTORY: Of dementia, hypertension, hypothyroidism.   SOCIAL HISTORY: No tobacco, alcohol, or drug usage. She lives with her  daughter who is her caretaker.   FAMILY HISTORY: No documentation of cardiovascular or pulmonary disease.   ALLERGIES: Seroquel, which causes bradycardia.   HOME MEDICATIONS: Aspirin 81 mg p.o. daily, lisinopril 5 mg 1/2 tablet p.o. daily, citalopram 40 mg p.o. daily, olanzapine 10 mg p.o. daily, donepezil 10 mg p.o. at bedtime, Metamucil 525 mg capsule 2 capsules at nighttime, Senokot 8.6 mg 2 tabs p.o. at bedtime, Combigan 0.2/0.5% ophthalmic solution 1 drop to each eye b.i.d., Lumigan 0.01% ophthalmic solution 1 drop to each eye at nighttime, Synthroid 75 mcg p.o. daily, vitamin D3 at 1000 international units p.o. daily.   PHYSICAL EXAMINATION:  VITAL SIGNS: Temperature 98.8, heart rate 78, respirations 20, , saturating 98% on room air. Weight 68.5 kg, BMI 23.7 kg.  GENERAL: Well-nourished, well-developed Caucasian female, currently in no acute distress.  HEAD: Normocephalic, atraumatic.  HEENT: Eyes: Pupils equal, round, and reactive to light. Extraocular muscles intact. No scleral icterus. Mouth: Moist mucous membranes. Dentition intact. No abscess noted. Ear, nose, and throat: Throat clear without exudates. No external lesions.  NECK: Supple. No thyromegaly. No nodules. No JVD.  PULMONARY: Clear to auscultation bilaterally without wheezes, rubs, or rhonchi. No use of accessory muscles. Good respiratory effort.  CHEST: Nontender to palpation.  CARDIOVASCULAR: S1, S2, regular rate and rhythm. No murmurs, rubs, or gallops. No edema. Pedal pulses 2+ bilaterally.  GASTROINTESTINAL: Soft, nontender, nondistended. No masses. Positive bowel sounds. No hepatosplenomegaly.  MUSCULOSKELETAL: Right wrist is currently immobilized. However, prior to immobilization, areas of ecchymosis and swelling.  NEUROLOGIC: Cranial nerves II through XII intact. No gross focal neurological deficits. Sensation intact. Reflexes intact.  SKIN: No ulcerations, lesions, rash, cyanosis. Skin warm, dry. Turgor intact.   PSYCHIATRIC: Mood and affect pleasantly confused. She is  awake, alert, oriented only to self, not to person or time. Insight and judgment poor.   LABORATORY DATA: EKG revealing normal sinus rhythm; however, a prolonged QTc with a QTc of 603. X-ray the right wrist reveals nondisplaced fracture of the radius.   Remainder of laboratory data: Sodium 140, potassium 3.1, chloride 107, bicarbonate 30, BUN 16, creatinine 0.93, glucose 92, troponin less than 0.02. WBC 11.9, hemoglobin 13.8, platelets 236,000. Urinalysis: WBCs 35, RBCs 26, leukocyte esterase 2+, nitrite negative.   ASSESSMENT AND PLAN: A 81 year old Caucasian female with past medical history of dementia, hypothyroidism, presenting after a mechanical fall, has a right radial fracture. 1. Right radial fracture, currently immobilized. Provide pain control. Orthopedic consult. Given recurrent falls, will check a vitamin D level as occasionally low vitamin D can be lead to falls.  2. Urinary tract infection, ceftriaxone.  3. Prolonged QTc. We will hold Celexa. As is prolonged. QTc, we will recheck an EKG tomorrow.  4. Hypokalemia. Replace potassium with a goal of 4 to 5.  5. Hypertension. Continue lisinopril.  6. Deep venous thrombosis prophylaxis with heparin subcutaneous.   CODE STATUS: The patient is a full code.  TIME SPENT: 45 minutes.   ____________________________ [name] Athens. Vander Kueker, MD dkh:lt D: 01/06/2014 21:38:14 ET T: 01/06/2014 22:58:50 ET JOB#: [id number]  cc: [name] Athens. Lawerence Dery, MD, <Dictator> [name] Hinchman Synetta Shadow MD ELECTRONICALLY SIGNED 01/07/2014 3:36

## 2015-12-22 IMAGING — CT CT HEAD WITHOUT CONTRAST
2 of 3 series · 15 of 30 positions shown, 17 images · non-contrast
Comparison: 05/01/2014, 03/05/2014

CLINICAL DATA: Weakness for few days, decreased level of
consciousness 3 days ago a nonverbal, decreased appetite, history
TA, RIGHT-side of face drooping, last seen normal 05/24/2014,
history dementia

EXAM:
CT HEAD WITHOUT CONTRAST
TECHNIQUE: Contiguous axial images were obtained from the base of the skull
through the vertex without intravenous contrast.

[Series 2: soft tissue · axial · 0.39mm/px · z∈[+518,+623]mm · 8 of 29 slices shown, 10 images]
[im 4/29  brain]
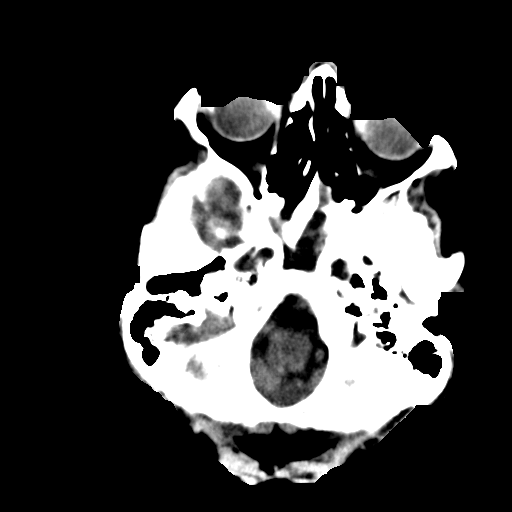
[im 4/29  bone]
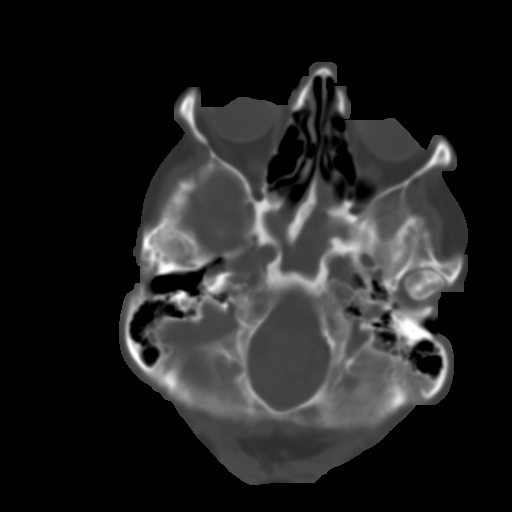
[im 7/29  brain]
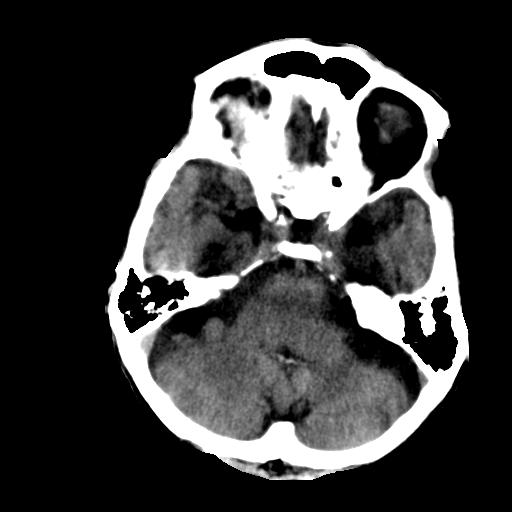
[im 10/29  brain]
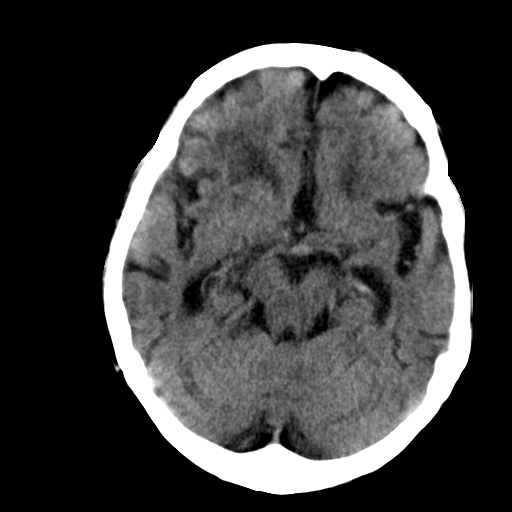
[im 13/29  brain]
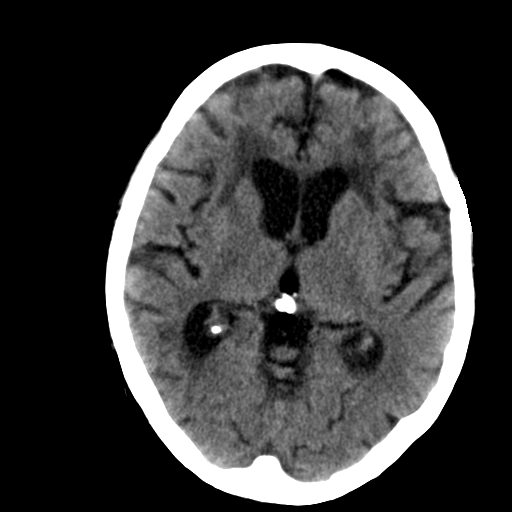
[im 16/29  brain]
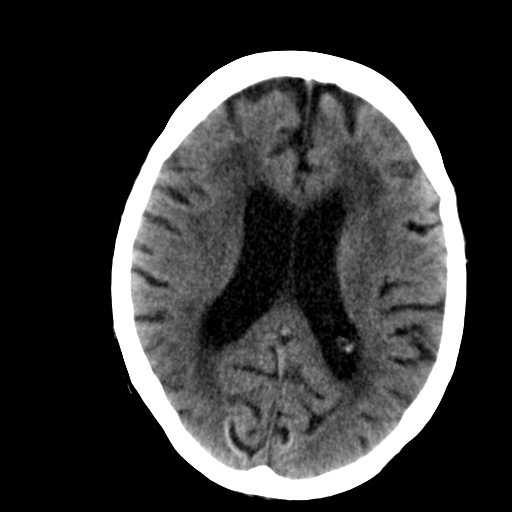
[im 16/29  bone]
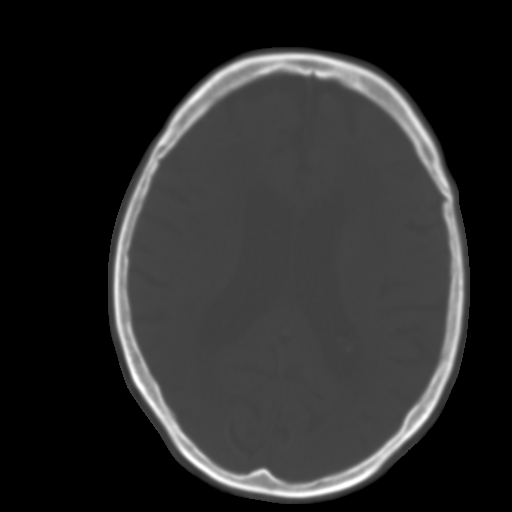
[im 19/29  brain]
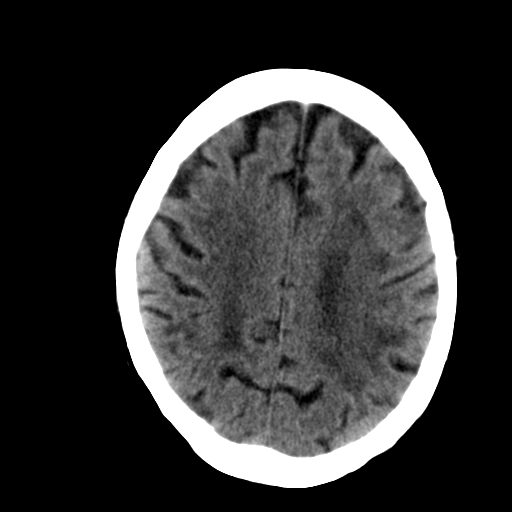
[im 22/29  brain]
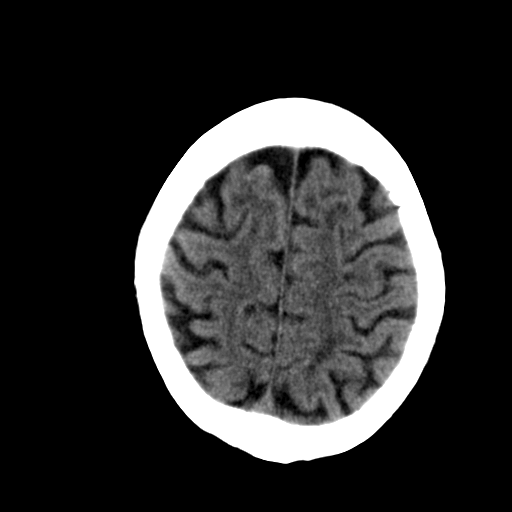
[im 25/29  brain]
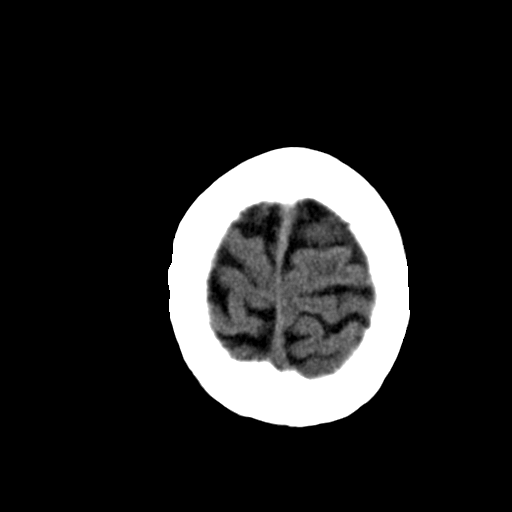

[Series 4: soft tissue-- · axial · 0.30mm/px · z∈[+527,+621]mm · 7 of 30 slices shown]
[im 4/30  brain]
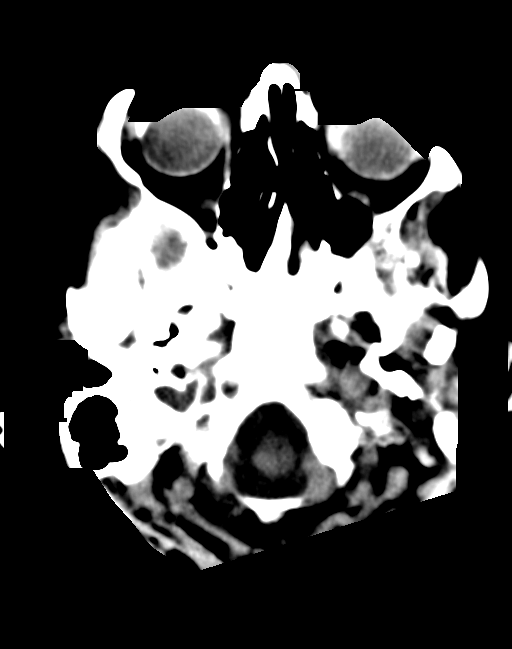
[im 7/30  brain]
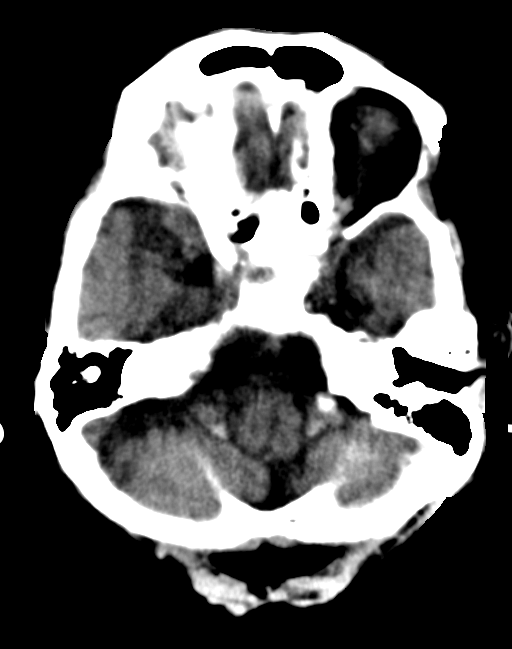
[im 10/30  brain]
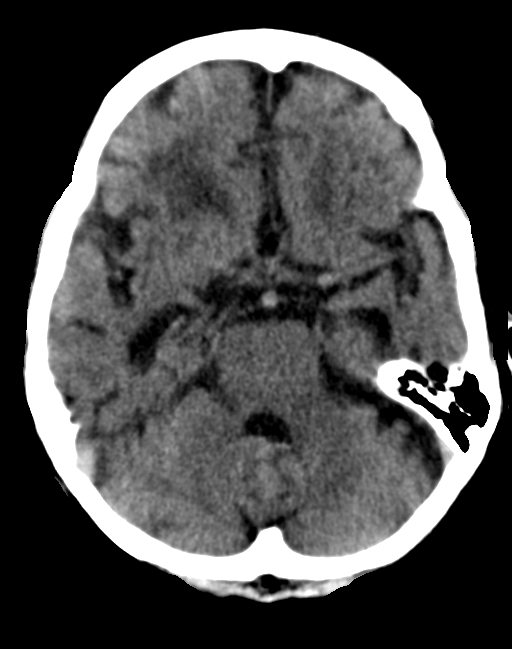
[im 13/30  brain]
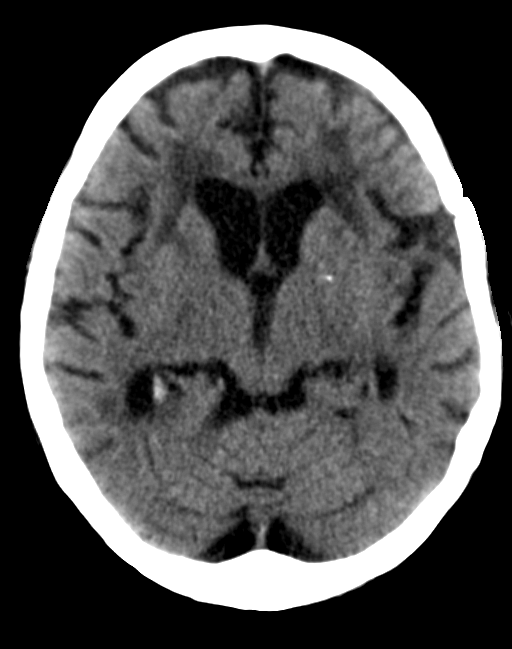
[im 17/30  brain]
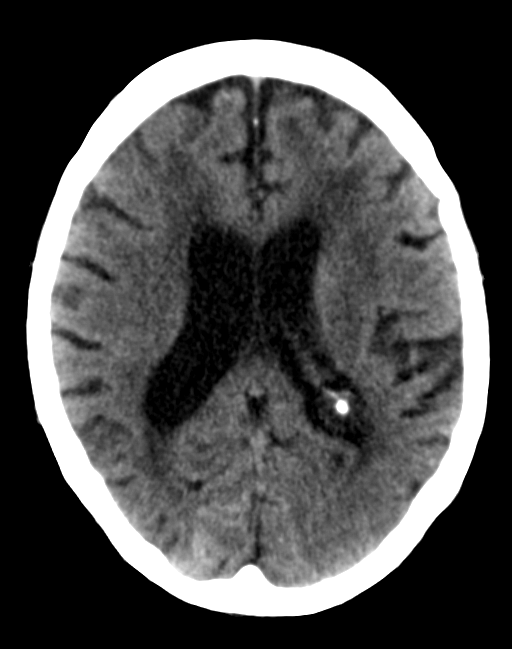
[im 20/30  brain]
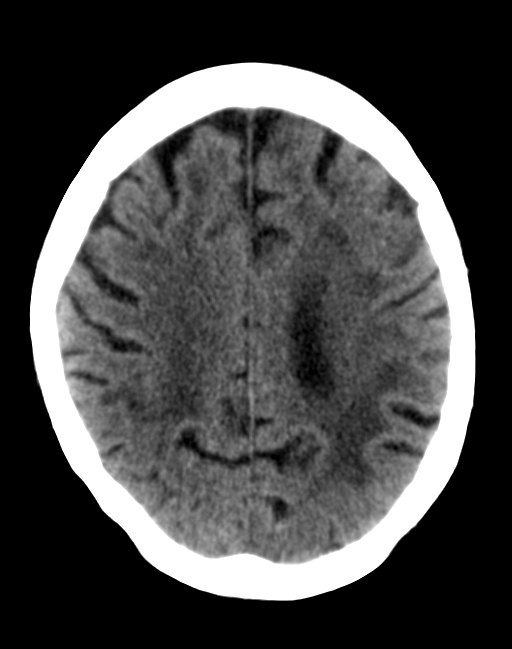
[im 23/30  brain]
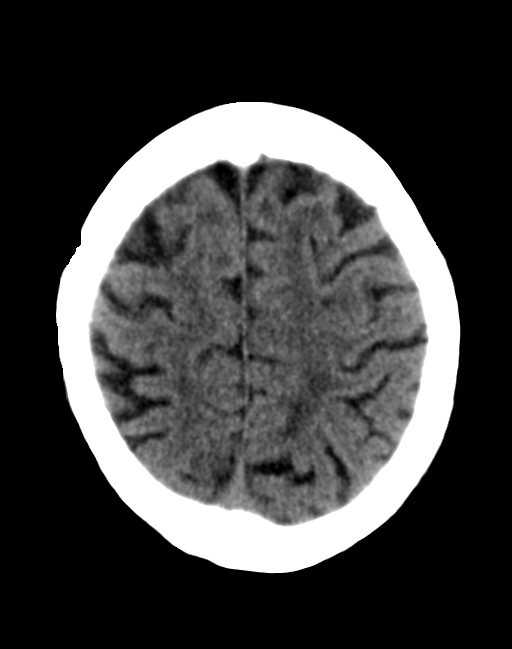

[15 of 30 positions shown; findings below may reference images not displayed]

FINDINGS: Generalized atrophy.

Normal ventricular morphology.

No midline shift or mass effect.

Small vessel chronic ischemic changes of deep cerebral white matter.

No intracranial hemorrhage, mass lesion, or acute infarction.

Increased in gyriform attenuation at the occipital lobes bilaterally
images 13-15 unchanged question calcification.

Visualized paranasal sinuses and mastoid air cells clear.

Bones demineralized.
IMPRESSION: Atrophy with small vessel chronic ischemic changes of deep cerebral
white matter.

No acute intracranial abnormalities.

No interval change.
